# Patient Record
Sex: Female | Born: 1950
Health system: Southern US, Community
[De-identification: ages and names within clinical notes are randomized; demographics above are authoritative.]

## PROBLEM LIST (undated history)

## (undated) DIAGNOSIS — T4145XA Adverse effect of unspecified anesthetic, initial encounter: Secondary | ICD-10-CM

## (undated) DIAGNOSIS — Z923 Personal history of irradiation: Secondary | ICD-10-CM

## (undated) DIAGNOSIS — R011 Cardiac murmur, unspecified: Secondary | ICD-10-CM

## (undated) DIAGNOSIS — I10 Essential (primary) hypertension: Secondary | ICD-10-CM

## (undated) DIAGNOSIS — H9190 Unspecified hearing loss, unspecified ear: Secondary | ICD-10-CM

## (undated) DIAGNOSIS — C50919 Malignant neoplasm of unspecified site of unspecified female breast: Secondary | ICD-10-CM

## (undated) DIAGNOSIS — T8859XA Other complications of anesthesia, initial encounter: Secondary | ICD-10-CM

## (undated) DIAGNOSIS — C801 Malignant (primary) neoplasm, unspecified: Secondary | ICD-10-CM

## (undated) DIAGNOSIS — R112 Nausea with vomiting, unspecified: Secondary | ICD-10-CM

## (undated) DIAGNOSIS — M1990 Unspecified osteoarthritis, unspecified site: Secondary | ICD-10-CM

## (undated) DIAGNOSIS — Z9889 Other specified postprocedural states: Secondary | ICD-10-CM

## (undated) HISTORY — PX: ABDOMINAL HYSTERECTOMY: SHX81

## (undated) HISTORY — DX: Malignant (primary) neoplasm, unspecified: C80.1

## (undated) HISTORY — DX: Malignant neoplasm of unspecified site of unspecified female breast: C50.919

## (undated) HISTORY — PX: GANGLION CYST EXCISION: SHX1691

## (undated) HISTORY — DX: Unspecified osteoarthritis, unspecified site: M19.90

## (undated) HISTORY — DX: Unspecified hearing loss, unspecified ear: H91.90

## (undated) HISTORY — DX: Cardiac murmur, unspecified: R01.1

---

## 1987-08-26 DIAGNOSIS — Z853 Personal history of malignant neoplasm of breast: Secondary | ICD-10-CM | POA: Insufficient documentation

## 1988-08-25 HISTORY — PX: BREAST SURGERY: SHX581

## 1988-08-25 HISTORY — PX: BREAST LUMPECTOMY: SHX2

## 1998-01-08 ENCOUNTER — Other Ambulatory Visit: Admission: RE | Admit: 1998-01-08 | Discharge: 1998-01-08 | Payer: Self-pay | Admitting: Otolaryngology

## 1998-02-07 ENCOUNTER — Ambulatory Visit (HOSPITAL_COMMUNITY): Admission: RE | Admit: 1998-02-07 | Discharge: 1998-02-07 | Payer: Self-pay | Admitting: Family Medicine

## 1999-08-09 ENCOUNTER — Other Ambulatory Visit: Admission: RE | Admit: 1999-08-09 | Discharge: 1999-08-09 | Payer: Self-pay | Admitting: *Deleted

## 1999-08-09 ENCOUNTER — Encounter (INDEPENDENT_AMBULATORY_CARE_PROVIDER_SITE_OTHER): Payer: Self-pay | Admitting: Specialist

## 2000-05-04 ENCOUNTER — Encounter: Admission: RE | Admit: 2000-05-04 | Discharge: 2000-05-04 | Payer: Self-pay | Admitting: Surgery

## 2000-05-04 ENCOUNTER — Encounter: Payer: Self-pay | Admitting: Surgery

## 2000-07-08 ENCOUNTER — Emergency Department (HOSPITAL_COMMUNITY): Admission: EM | Admit: 2000-07-08 | Discharge: 2000-07-08 | Payer: Self-pay | Admitting: Emergency Medicine

## 2001-05-17 ENCOUNTER — Encounter: Admission: RE | Admit: 2001-05-17 | Discharge: 2001-05-17 | Payer: Self-pay | Admitting: *Deleted

## 2001-05-17 ENCOUNTER — Encounter: Payer: Self-pay | Admitting: *Deleted

## 2001-05-24 ENCOUNTER — Other Ambulatory Visit: Admission: RE | Admit: 2001-05-24 | Discharge: 2001-05-24 | Payer: Self-pay | Admitting: *Deleted

## 2002-05-23 ENCOUNTER — Encounter: Payer: Self-pay | Admitting: *Deleted

## 2002-05-23 ENCOUNTER — Encounter: Admission: RE | Admit: 2002-05-23 | Discharge: 2002-05-23 | Payer: Self-pay | Admitting: *Deleted

## 2002-05-30 ENCOUNTER — Encounter: Admission: RE | Admit: 2002-05-30 | Discharge: 2002-05-30 | Payer: Self-pay | Admitting: *Deleted

## 2002-05-30 ENCOUNTER — Encounter: Payer: Self-pay | Admitting: *Deleted

## 2002-05-30 ENCOUNTER — Other Ambulatory Visit: Admission: RE | Admit: 2002-05-30 | Discharge: 2002-05-30 | Payer: Self-pay | Admitting: *Deleted

## 2002-11-25 ENCOUNTER — Encounter: Payer: Self-pay | Admitting: Family Medicine

## 2002-11-25 ENCOUNTER — Ambulatory Visit (HOSPITAL_COMMUNITY): Admission: RE | Admit: 2002-11-25 | Discharge: 2002-11-25 | Payer: Self-pay | Admitting: Family Medicine

## 2002-12-14 ENCOUNTER — Encounter (INDEPENDENT_AMBULATORY_CARE_PROVIDER_SITE_OTHER): Payer: Self-pay

## 2002-12-14 ENCOUNTER — Ambulatory Visit (HOSPITAL_BASED_OUTPATIENT_CLINIC_OR_DEPARTMENT_OTHER): Admission: RE | Admit: 2002-12-14 | Discharge: 2002-12-14 | Payer: Self-pay | Admitting: Orthopedic Surgery

## 2003-06-19 ENCOUNTER — Encounter: Payer: Self-pay | Admitting: *Deleted

## 2003-06-19 ENCOUNTER — Encounter: Admission: RE | Admit: 2003-06-19 | Discharge: 2003-06-19 | Payer: Self-pay | Admitting: *Deleted

## 2003-07-10 ENCOUNTER — Other Ambulatory Visit: Admission: RE | Admit: 2003-07-10 | Discharge: 2003-07-10 | Payer: Self-pay | Admitting: *Deleted

## 2004-06-19 ENCOUNTER — Encounter: Admission: RE | Admit: 2004-06-19 | Discharge: 2004-06-19 | Payer: Self-pay | Admitting: *Deleted

## 2004-07-01 ENCOUNTER — Encounter: Admission: RE | Admit: 2004-07-01 | Discharge: 2004-07-01 | Payer: Self-pay | Admitting: *Deleted

## 2004-10-21 ENCOUNTER — Other Ambulatory Visit: Admission: RE | Admit: 2004-10-21 | Discharge: 2004-10-21 | Payer: Self-pay | Admitting: *Deleted

## 2005-01-06 ENCOUNTER — Encounter: Admission: RE | Admit: 2005-01-06 | Discharge: 2005-01-06 | Payer: Self-pay | Admitting: Surgery

## 2005-08-11 ENCOUNTER — Encounter: Admission: RE | Admit: 2005-08-11 | Discharge: 2005-08-11 | Payer: Self-pay | Admitting: *Deleted

## 2005-09-02 ENCOUNTER — Encounter: Admission: RE | Admit: 2005-09-02 | Discharge: 2005-09-02 | Payer: Self-pay | Admitting: *Deleted

## 2005-12-15 ENCOUNTER — Other Ambulatory Visit: Admission: RE | Admit: 2005-12-15 | Discharge: 2005-12-15 | Payer: Self-pay | Admitting: *Deleted

## 2006-08-26 ENCOUNTER — Encounter: Admission: RE | Admit: 2006-08-26 | Discharge: 2006-08-26 | Payer: Self-pay | Admitting: Surgery

## 2007-03-09 ENCOUNTER — Other Ambulatory Visit: Admission: RE | Admit: 2007-03-09 | Discharge: 2007-03-09 | Payer: Self-pay | Admitting: *Deleted

## 2007-08-30 ENCOUNTER — Encounter: Admission: RE | Admit: 2007-08-30 | Discharge: 2007-08-30 | Payer: Self-pay | Admitting: Surgery

## 2008-09-04 ENCOUNTER — Encounter: Admission: RE | Admit: 2008-09-04 | Discharge: 2008-09-04 | Payer: Self-pay | Admitting: Gynecology

## 2009-08-25 HISTORY — PX: STAPEDES SURGERY: SHX789

## 2009-09-10 ENCOUNTER — Encounter: Admission: RE | Admit: 2009-09-10 | Discharge: 2009-09-10 | Payer: Self-pay | Admitting: Surgery

## 2010-05-20 ENCOUNTER — Encounter: Admission: RE | Admit: 2010-05-20 | Discharge: 2010-05-20 | Payer: Self-pay | Admitting: Otolaryngology

## 2010-07-01 ENCOUNTER — Encounter: Admission: RE | Admit: 2010-07-01 | Discharge: 2010-07-01 | Payer: Self-pay | Admitting: Family Medicine

## 2010-09-16 ENCOUNTER — Encounter
Admission: RE | Admit: 2010-09-16 | Discharge: 2010-09-16 | Payer: Self-pay | Source: Home / Self Care | Attending: Surgery | Admitting: Surgery

## 2010-12-30 ENCOUNTER — Other Ambulatory Visit: Payer: Self-pay | Admitting: Otolaryngology

## 2011-01-10 NOTE — Op Note (Signed)
   [name], Pamela Summers                             ACCOUNT NO.:  [id number]   MEDICAL RECORD NO.:  [id number]                   PATIENT TYPE:  AMB   LOCATION:  DSC                                  FACILITY:  [facility]   PHYSICIAN:  Artist Pais. Mina Marble, M.D.           DATE OF BIRTH:  1950/10/02   DATE OF PROCEDURE:  12/14/2002  DATE OF DISCHARGE:                                 OPERATIVE REPORT   PREOPERATIVE DIAGNOSIS:  Mass left hand in the webspace between thumb and  index finger.   POSTOPERATIVE DIAGNOSIS:  Mass left hand in the webspace between thumb and  index finger.   PROCEDURE:  Excision and biopsy of mass deep webspace thumb and index finger  left hand.   SURGEON:  Artist Pais. Mina Marble, M.D.   ASSISTANT:  Aura Fey. Bobbe Medico.   ANESTHESIA:  Bier block.   TOURNIQUET TIME:  30 minutes.   COMPLICATIONS:  None.   DRAINS:  None.   SPECIMENS:  One specimen sent.   DESCRIPTION OF PROCEDURE:  The patient was taken to the operating room where  after the induction of adequate Bier block analgesia the left upper  extremity was prepped and draped in the usual sterile fashion.  Once this  was done a Brunner-type incision was made in the webspace between the thumb  and index finger.  Dissection volarly was carried out until the digital  nerve to the index finger on the radial side was identified and retracted  safely.  The thenar muscle was split and a large lipoma was encountered  coming from the thenar musculature.  Dissection was carried down using a  combination of blunt dissection with the scissors and a Therapist, nutritional until  a large lipoma was carefully resected in its entirety.  The wound was then  thoroughly irrigated.  Hemostasis was achieved with bipolar cautery.  The  wound was loosely closed with 5-0 nylon in a combination of simple and  horizontal mattress sutures.  Sterile dressing of Xeroform, 4x4's, fluffs,  and compressive hand dressing was applied.  The patient  tolerated the  procedure well and went to recovery room in stable fashion.                                               Artist Pais Mina Marble, M.D.    MAW/MEDQ  D:  12/14/2002  T:  12/14/2002  Job:  [id number]

## 2011-09-01 ENCOUNTER — Other Ambulatory Visit: Payer: Self-pay | Admitting: Gynecology

## 2011-09-01 DIAGNOSIS — Z1231 Encounter for screening mammogram for malignant neoplasm of breast: Secondary | ICD-10-CM

## 2011-09-22 ENCOUNTER — Ambulatory Visit
Admission: RE | Admit: 2011-09-22 | Discharge: 2011-09-22 | Disposition: A | Discharge status: Home / Self Care | Payer: Federal, State, Local not specified - PPO | Source: Ambulatory Visit | Attending: Gynecology | Admitting: Gynecology

## 2011-09-22 DIAGNOSIS — Z1231 Encounter for screening mammogram for malignant neoplasm of breast: Secondary | ICD-10-CM

## 2011-09-24 ENCOUNTER — Other Ambulatory Visit: Payer: Self-pay | Admitting: Gynecology

## 2011-09-24 DIAGNOSIS — R928 Other abnormal and inconclusive findings on diagnostic imaging of breast: Secondary | ICD-10-CM

## 2011-10-06 ENCOUNTER — Ambulatory Visit
Admission: RE | Admit: 2011-10-06 | Discharge: 2011-10-06 | Disposition: A | Discharge status: Home / Self Care | Payer: Federal, State, Local not specified - PPO | Source: Ambulatory Visit | Attending: Gynecology | Admitting: Gynecology

## 2011-10-06 DIAGNOSIS — R928 Other abnormal and inconclusive findings on diagnostic imaging of breast: Secondary | ICD-10-CM

## 2012-01-02 ENCOUNTER — Other Ambulatory Visit: Payer: Self-pay | Admitting: Sports Medicine

## 2012-01-02 DIAGNOSIS — M25512 Pain in left shoulder: Secondary | ICD-10-CM

## 2012-01-02 DIAGNOSIS — M542 Cervicalgia: Secondary | ICD-10-CM

## 2012-01-13 ENCOUNTER — Ambulatory Visit
Admission: RE | Admit: 2012-01-13 | Discharge: 2012-01-13 | Disposition: A | Discharge status: Home / Self Care | Payer: Federal, State, Local not specified - PPO | Source: Ambulatory Visit | Attending: Sports Medicine | Admitting: Sports Medicine

## 2012-01-13 DIAGNOSIS — M542 Cervicalgia: Secondary | ICD-10-CM

## 2012-01-13 DIAGNOSIS — M25512 Pain in left shoulder: Secondary | ICD-10-CM

## 2012-02-18 ENCOUNTER — Other Ambulatory Visit: Payer: Self-pay | Admitting: Gynecology

## 2012-02-18 DIAGNOSIS — N6320 Unspecified lump in the left breast, unspecified quadrant: Secondary | ICD-10-CM

## 2012-03-29 ENCOUNTER — Ambulatory Visit
Admission: RE | Admit: 2012-03-29 | Discharge: 2012-03-29 | Disposition: A | Discharge status: Home / Self Care | Payer: Federal, State, Local not specified - PPO | Source: Ambulatory Visit | Attending: Gynecology | Admitting: Gynecology

## 2012-03-29 DIAGNOSIS — N6320 Unspecified lump in the left breast, unspecified quadrant: Secondary | ICD-10-CM

## 2012-05-10 ENCOUNTER — Ambulatory Visit: Payer: Federal, State, Local not specified - PPO

## 2012-05-10 ENCOUNTER — Ambulatory Visit (INDEPENDENT_AMBULATORY_CARE_PROVIDER_SITE_OTHER): Payer: Federal, State, Local not specified - PPO | Admitting: Emergency Medicine

## 2012-05-10 VITALS — BP 138/85 | HR 79 | Temp 98.6°F | Resp 18 | Ht 66.0 in | Wt 161.0 lb

## 2012-05-10 DIAGNOSIS — M549 Dorsalgia, unspecified: Secondary | ICD-10-CM

## 2012-05-10 LAB — POCT URINALYSIS DIPSTICK
Bilirubin, UA: NEGATIVE
Glucose, UA: NEGATIVE
Ketones, UA: NEGATIVE
Spec Grav, UA: 1.02
Urobilinogen, UA: 0.2

## 2012-05-10 MED ORDER — HYDROCODONE-ACETAMINOPHEN 5-325 MG PO TABS: 1.0000 | ORAL_TABLET | Freq: Four times a day (QID) | ORAL | Status: DC | PRN

## 2012-05-10 MED ORDER — PREDNISONE 20 MG PO TABS: ORAL_TABLET | ORAL | Status: DC

## 2012-05-10 NOTE — Progress Notes (Signed)
  Subjective:    Patient ID: Pamela Summers, female    DOB: 23-Feb-1951, 61 y.o.   MRN: [id number]  HPI patient enters with severe pain in her right lower back and into the right hip. She denies any injury to this area. She has a history of problems a few years ago but has done well since that time. She does work at [employer] and so is involved in lifting pushing and pulling.    Review of Systems     Objective:   Physical Exam there is no CVA pain. There is tenderness over the right SI joint. There is decreased range of motion the right hip. Straight leg raising on the right leg is positive at about 75. Motor strength of the right leg is diminished but I suspect is secondary to pain rather than a true weakness. Left leg exam is normal  [facility] reading (PRIMARY) by  Dr. Cleta Alberts x-rays do not reveal any fractures or malalignment.  Results for orders placed in visit on 05/10/12  POCT URINALYSIS DIPSTICK      Component Value Range   Color, UA yellow     Clarity, UA clear     Glucose, UA neg     Bilirubin, UA neg     Ketones, UA neg     Spec Grav, UA 1.020     Blood, UA neg     pH, UA 5.0     Protein, UA neg     Urobilinogen, UA 0.2     Nitrite, UA neg     Leukocytes, UA Negative          Assessment & Plan:  Patient here with low back pain with radicular symptoms into the right hip. We'll treat with a tapered dose of prednisone pain medications recheck on Thursday to be sure improved

## 2012-05-10 NOTE — Patient Instructions (Addendum)
Back Pain, Adult Low back pain is very common. About 1 in 5 people have back pain.The cause of low back pain is rarely dangerous. The pain often gets better over time.About half of people with a sudden onset of back pain feel better in just 2 weeks. About 8 in 10 people feel better by 6 weeks.  CAUSES Some common causes of back pain include:  Strain of the muscles or ligaments supporting the spine.   Wear and tear (degeneration) of the spinal discs.   Arthritis.   Direct injury to the back.  DIAGNOSIS Most of the time, the direct cause of low back pain is not known.However, back pain can be treated effectively even when the exact cause of the pain is unknown.Answering your caregiver's questions about your overall health and symptoms is one of the most accurate ways to make sure the cause of your pain is not dangerous. If your caregiver needs more information, he or she may order lab work or imaging tests (X-rays or MRIs).However, even if imaging tests show changes in your back, this usually does not require surgery. HOME CARE INSTRUCTIONS For many people, back pain returns.Since low back pain is rarely dangerous, it is often a condition that people can learn to manageon their own.   Remain active. It is stressful on the back to sit or stand in one place. Do not sit, drive, or stand in one place for more than 30 minutes at a time. Take short walks on level surfaces as soon as pain allows.Try to increase the length of time you walk each day.   Do not stay in bed.Resting more than 1 or 2 days can delay your recovery.   Do not avoid exercise or work.Your body is made to move.It is not dangerous to be active, even though your back may hurt.Your back will likely heal faster if you return to being active before your pain is gone.   Pay attention to your body when you bend and lift. Many people have less discomfortwhen lifting if they bend their knees, keep the load close to their  bodies,and avoid twisting. Often, the most comfortable positions are those that put less stress on your recovering back.   Find a comfortable position to sleep. Use a firm mattress and lie on your side with your knees slightly bent. If you lie on your back, put a pillow under your knees.   Only take over-the-counter or prescription medicines as directed by your caregiver. Over-the-counter medicines to reduce pain and inflammation are often the most helpful.Your caregiver may prescribe muscle relaxant drugs.These medicines help dull your pain so you can more quickly return to your normal activities and healthy exercise.   Put ice on the injured area.   Put ice in a plastic bag.   Place a towel between your skin and the bag.   Leave the ice on for 15 to 20 minutes, 3 to 4 times a day for the first 2 to 3 days. After that, ice and heat may be alternated to reduce pain and spasms.   Ask your caregiver about trying back exercises and gentle massage. This may be of some benefit.   Avoid feeling anxious or stressed.Stress increases muscle tension and can worsen back pain.It is important to recognize when you are anxious or stressed and learn ways to manage it.Exercise is a great option.  SEEK MEDICAL CARE IF:  You have pain that is not relieved with rest or medicine.   You have   pain that does not improve in 1 week.   You have new symptoms.   You are generally not feeling well.  SEEK IMMEDIATE MEDICAL CARE IF:   You have pain that radiates from your back into your legs.   You develop new bowel or bladder control problems.   You have unusual weakness or numbness in your arms or legs.   You develop nausea or vomiting.   You develop abdominal pain.   You feel faint.  Document Released: 08/11/2005 Document Revised: 07/31/2011 Document Reviewed: 12/30/2010 Exit[employer] Patient Information 2012 [employer]. 

## 2012-05-13 ENCOUNTER — Ambulatory Visit: Payer: Federal, State, Local not specified - PPO

## 2012-05-13 ENCOUNTER — Ambulatory Visit (INDEPENDENT_AMBULATORY_CARE_PROVIDER_SITE_OTHER): Payer: Federal, State, Local not specified - PPO | Admitting: Emergency Medicine

## 2012-05-13 VITALS — BP 120/76 | HR 81 | Temp 98.4°F | Resp 16 | Ht 66.0 in | Wt 165.0 lb

## 2012-05-13 DIAGNOSIS — R05 Cough: Secondary | ICD-10-CM

## 2012-05-13 DIAGNOSIS — J40 Bronchitis, not specified as acute or chronic: Secondary | ICD-10-CM

## 2012-05-13 DIAGNOSIS — R062 Wheezing: Secondary | ICD-10-CM

## 2012-05-13 DIAGNOSIS — R059 Cough, unspecified: Secondary | ICD-10-CM

## 2012-05-13 DIAGNOSIS — R9431 Abnormal electrocardiogram [ECG] [EKG]: Secondary | ICD-10-CM

## 2012-05-13 MED ORDER — CEFDINIR 300 MG PO CAPS: 600.0000 mg | ORAL_CAPSULE | Freq: Every day | ORAL | Status: DC

## 2012-05-13 MED ORDER — ALBUTEROL SULFATE (2.5 MG/3ML) 0.083% IN NEBU
2.5000 mg | INHALATION_SOLUTION | Freq: Once | RESPIRATORY_TRACT | Status: AC
  Administered 2012-05-13: 2.5 mg via RESPIRATORY_TRACT

## 2012-05-13 NOTE — Progress Notes (Signed)
  Subjective:    Patient ID: Pamela Summers, female    DOB: [birth year], 61 y.o.   MRN: [id number]  HPI  Patient is here for a follow up.  She still has the cough and having pain in the lower back.  On the fourth day of her prednisone.  Patient would like a MRI of back.  Patient states that she is real sore in the lower mid back.  Coughing up thick mucus. She states she has recently completed her antibiotics. She continues to have pain in her right SI joint. She denies any weakness or radicular symptoms down her right leg. She has no bowel or bladder symptoms.       Review of Systems     Objective:   Physical Exam examination the chest reveals diminished breath sounds in the bases with prolongation of expiration without true wheezes. Her cardiac exam is unremarkable. Examination of the back reveals significant tenderness superior right SI joint. Straight leg raising is negative to 90 both legs. Motor strength is 5 out of 5 all muscle groups. Sensory exam is Normal.  [facility] reading (PRIMARY) by  Dr.Daub patient is an increase in heart size no pneumonia is seen. There are clips present in the right anterior chest. There is mild increase in markings  EKG T wave inversion V1 V2      Assessment & Plan:  Patient improved on prednisone. She does have a residual bronchitis and I have added omnicef  one a day. We'll see back next Wednesday decide on need for an MRI at that time I have referred her back to Dr. Garnette Scheuermann who is her cardiologist since she has not seen him in a few years.

## 2012-05-18 ENCOUNTER — Telehealth: Payer: Self-pay

## 2012-05-18 NOTE — Telephone Encounter (Signed)
Please call patient later noted there is an abnormal area over one of the ribs and clavicle. She needs to see me in 2 weeks for a repeat chest x-ray to be sure these areas resolve. If they do not we will need to do a CT of her chest. I have called Patient to advise.

## 2012-05-18 NOTE — Telephone Encounter (Signed)
Pt returning call re: her x-ray.  [phone]

## 2012-05-19 ENCOUNTER — Ambulatory Visit (INDEPENDENT_AMBULATORY_CARE_PROVIDER_SITE_OTHER): Payer: Federal, State, Local not specified - PPO | Admitting: Emergency Medicine

## 2012-05-19 VITALS — BP 134/88 | HR 65 | Temp 98.4°F | Resp 16 | Ht 66.0 in | Wt 165.0 lb

## 2012-05-19 DIAGNOSIS — J40 Bronchitis, not specified as acute or chronic: Secondary | ICD-10-CM

## 2012-05-19 DIAGNOSIS — R9389 Abnormal findings on diagnostic imaging of other specified body structures: Secondary | ICD-10-CM

## 2012-05-19 DIAGNOSIS — R918 Other nonspecific abnormal finding of lung field: Secondary | ICD-10-CM

## 2012-05-19 DIAGNOSIS — I517 Cardiomegaly: Secondary | ICD-10-CM

## 2012-05-19 NOTE — Progress Notes (Signed)
  Subjective:    Patient ID: Pamela Summers, female    DOB: July 21, 1951, 61 y.o.   MRN: [id number]  HPI Followup of her respiratory infection. She's been on Omnicef and cough medication and doing much better. The radiologist did see an abnormal area on her chest x-ray and is concerned it may be in the bone beneath the left clavicle.   Review of Systems     Objective:   Physical Exam patient is alert and cooperative appears well today. Her physical examination her neck is supple. Her chest is clear to both auscultation and percussion. There is no palpable abnormality of the left upper anterior chest        Assessment & Plan:  Patient doing well with a respiratory infection. She is scheduled to see Dr. Katrinka Blazing this afternoon. CT chest with and without contrast to evaluate the lesion seen on chest x-ray

## 2012-05-24 ENCOUNTER — Other Ambulatory Visit: Payer: Federal, State, Local not specified - PPO

## 2012-05-24 ENCOUNTER — Ambulatory Visit
Admission: RE | Admit: 2012-05-24 | Discharge: 2012-05-24 | Disposition: A | Discharge status: Home / Self Care | Payer: Federal, State, Local not specified - PPO | Source: Ambulatory Visit | Attending: Emergency Medicine | Admitting: Emergency Medicine

## 2012-05-24 DIAGNOSIS — R9389 Abnormal findings on diagnostic imaging of other specified body structures: Secondary | ICD-10-CM

## 2012-05-24 MED ORDER — IOHEXOL 300 MG/ML  SOLN
75.0000 mL | Freq: Once | INTRAMUSCULAR | Status: AC | PRN
  Administered 2012-05-24: 75 mL via INTRAVENOUS

## 2012-05-25 ENCOUNTER — Telehealth: Payer: Self-pay

## 2012-05-25 NOTE — Telephone Encounter (Signed)
Pt spoke with Renee in labs earlier today and she is needing to see if a copy of her labs can be sent to Dr. Verdis Prime please fax to [phone] if questions contact their office @ [phone]

## 2012-05-26 NOTE — Telephone Encounter (Signed)
Faxed lab, xray report, and CT report to the fax number pt gave.

## 2012-08-10 ENCOUNTER — Other Ambulatory Visit: Payer: Self-pay | Admitting: Gynecology

## 2012-08-10 DIAGNOSIS — Z1231 Encounter for screening mammogram for malignant neoplasm of breast: Secondary | ICD-10-CM

## 2012-08-25 HISTORY — PX: MASTECTOMY: SHX3

## 2012-08-25 HISTORY — PX: BREAST BIOPSY: SHX20

## 2012-09-27 ENCOUNTER — Ambulatory Visit
Admission: RE | Admit: 2012-09-27 | Discharge: 2012-09-27 | Disposition: A | Discharge status: Home / Self Care | Payer: Federal, State, Local not specified - PPO | Source: Ambulatory Visit | Attending: Gynecology | Admitting: Gynecology

## 2012-09-27 DIAGNOSIS — Z1231 Encounter for screening mammogram for malignant neoplasm of breast: Secondary | ICD-10-CM

## 2012-09-28 ENCOUNTER — Other Ambulatory Visit: Payer: Self-pay | Admitting: Gynecology

## 2012-09-28 DIAGNOSIS — R928 Other abnormal and inconclusive findings on diagnostic imaging of breast: Secondary | ICD-10-CM

## 2012-10-04 ENCOUNTER — Ambulatory Visit
Admission: RE | Admit: 2012-10-04 | Discharge: 2012-10-04 | Disposition: A | Discharge status: Home / Self Care | Payer: Federal, State, Local not specified - PPO | Source: Ambulatory Visit | Attending: Gynecology | Admitting: Gynecology

## 2012-10-04 ENCOUNTER — Other Ambulatory Visit: Payer: Self-pay | Admitting: Gynecology

## 2012-10-04 ENCOUNTER — Ambulatory Visit
Admission: RE | Admit: 2012-10-04 | Discharge: 2012-10-04 | Disposition: A | Discharge status: Home / Self Care | Payer: Federal, State, Local not specified - PPO | Source: Ambulatory Visit

## 2012-10-04 DIAGNOSIS — R928 Other abnormal and inconclusive findings on diagnostic imaging of breast: Secondary | ICD-10-CM

## 2012-10-05 ENCOUNTER — Other Ambulatory Visit (INDEPENDENT_AMBULATORY_CARE_PROVIDER_SITE_OTHER): Payer: Self-pay | Admitting: Surgery

## 2012-10-05 ENCOUNTER — Ambulatory Visit
Admission: RE | Admit: 2012-10-05 | Discharge: 2012-10-05 | Disposition: A | Discharge status: Home / Self Care | Payer: Federal, State, Local not specified - PPO | Source: Ambulatory Visit | Attending: Gynecology | Admitting: Gynecology

## 2012-10-05 ENCOUNTER — Other Ambulatory Visit: Payer: Self-pay | Admitting: Gynecology

## 2012-10-05 DIAGNOSIS — R928 Other abnormal and inconclusive findings on diagnostic imaging of breast: Secondary | ICD-10-CM

## 2012-10-05 DIAGNOSIS — D0511 Intraductal carcinoma in situ of right breast: Secondary | ICD-10-CM

## 2012-10-10 ENCOUNTER — Ambulatory Visit
Admission: RE | Admit: 2012-10-10 | Discharge: 2012-10-10 | Disposition: A | Discharge status: Home / Self Care | Payer: Federal, State, Local not specified - PPO | Source: Ambulatory Visit | Attending: Gynecology | Admitting: Gynecology

## 2012-10-10 DIAGNOSIS — D0511 Intraductal carcinoma in situ of right breast: Secondary | ICD-10-CM

## 2012-10-10 MED ORDER — GADOBENATE DIMEGLUMINE 529 MG/ML IV SOLN
14.0000 mL | Freq: Once | INTRAVENOUS | Status: AC | PRN
  Administered 2012-10-10: 14 mL via INTRAVENOUS

## 2012-10-11 ENCOUNTER — Inpatient Hospital Stay: Admission: RE | Admit: 2012-10-11 | Payer: Federal, State, Local not specified - PPO | Source: Ambulatory Visit

## 2012-10-15 ENCOUNTER — Ambulatory Visit (INDEPENDENT_AMBULATORY_CARE_PROVIDER_SITE_OTHER): Payer: Federal, State, Local not specified - PPO | Admitting: Surgery

## 2012-10-15 ENCOUNTER — Encounter (INDEPENDENT_AMBULATORY_CARE_PROVIDER_SITE_OTHER): Payer: Self-pay | Admitting: Surgery

## 2012-10-15 VITALS — BP 170/98 | HR 76 | Temp 97.8°F | Resp 18 | Ht 66.0 in | Wt 164.2 lb

## 2012-10-15 DIAGNOSIS — Z853 Personal history of malignant neoplasm of breast: Secondary | ICD-10-CM

## 2012-10-15 DIAGNOSIS — C50919 Malignant neoplasm of unspecified site of unspecified female breast: Secondary | ICD-10-CM

## 2012-10-15 DIAGNOSIS — C50911 Malignant neoplasm of unspecified site of right female breast: Secondary | ICD-10-CM | POA: Insufficient documentation

## 2012-10-15 NOTE — Progress Notes (Signed)
Patient ID: Pamela Summers, female   DOB: 1950-09-07, 62 y.o.   MRN: [id number]  Chief Complaint  Patient presents with  . Breast Cancer    Right    HPI Pamela Summers is a 62 y.o. female.  She recently had a mammogram and an abnormality found, confirmed by sono, biopsied ans was DCIS, weakly receptor +. She had a lumpectomy and node dissectin in 1989 for what I think was invasive ductal in the right breast, upper ouoter quadrant. The current abnormality is tight breast upper inner quadrant. She has no other breast sypmtoms. Her mother may have had ovarian cancer, but she is unsure. She asked about BRCA testing because she was about age 77 at the first cancer. She comes to discyuss surgical options HPI  Past Medical History  Diagnosis Date  . Arthritis   . Heart murmur   . Cancer   . Hearing loss     Past Surgical History  Procedure Laterality Date  . Abdominal hysterectomy      partical  . Breast surgery  1990    lumpectomy - right  . Ganglion cyst excision  2002 - approximate  . Stapedes surgery Right 2011    Family History  Problem Relation Age of Onset  . Cancer Mother     uterine  . Heart disease Father     Social History History  Substance Use Topics  . Smoking status: Never Smoker   . Smokeless tobacco: Never Used  . Alcohol Use: No    No Known Allergies  No current outpatient prescriptions on file.   No current facility-administered medications for this visit.    Review of Systems Review of Systems  Constitutional: Negative for fever, chills and unexpected weight change.  HENT: Positive for hearing loss. Negative for congestion, sore throat, trouble swallowing and voice change.   Eyes: Negative for visual disturbance.  Respiratory: Negative for cough and wheezing.   Cardiovascular: Negative for chest pain, palpitations and leg swelling.  Gastrointestinal: Negative for nausea, vomiting, abdominal pain, diarrhea, constipation, blood in stool, abdominal  distention and anal bleeding.  Genitourinary: Negative for hematuria, vaginal bleeding and difficulty urinating.  Musculoskeletal: Negative for arthralgias.  Skin: Negative for rash and wound.  Neurological: Negative for seizures, syncope and headaches.  Hematological: Negative for adenopathy. Does not bruise/bleed easily.  Psychiatric/Behavioral: Negative for confusion.    Blood pressure 170/98, pulse 76, temperature 97.8 F (36.6 C), temperature source Temporal, resp. rate 18, height 5\' 6"  (1.676 m), weight 164 lb 4 oz (74.503 kg).  Physical Exam Physical Exam  Vitals reviewed. Constitutional: She is oriented to person, place, and time. She appears well-developed and well-nourished. No distress.  HENT:  Head: Normocephalic and atraumatic.  Mouth/Throat: Oropharynx is clear and moist.  Eyes: Conjunctivae and EOM are normal. Pupils are equal, round, and reactive to light. No scleral icterus.  Neck: Normal range of motion. Neck supple. No tracheal deviation present. No thyromegaly present.  Cardiovascular: Normal rate, regular rhythm, normal heart sounds and intact distal pulses.  Exam reveals no gallop and no friction rub.   No murmur heard. Pulmonary/Chest: Effort normal and breath sounds normal. No respiratory distress. She has no wheezes. She has no rales. Right breast exhibits tenderness. Right breast exhibits no inverted nipple, no mass, no nipple discharge and no skin change. Left breast exhibits tenderness. Left breast exhibits no inverted nipple, no mass, no nipple discharge and no skin change. Breasts are asymmetrical.    Surgical scars as noted.  Right smaller than left and some minor skin changes from radiation  Abdominal: Soft. Bowel sounds are normal. She exhibits no distension and no mass. There is no tenderness. There is no rebound and no guarding.  Musculoskeletal: Normal range of motion. She exhibits no edema and no tenderness.  Lymphadenopathy:    She has no cervical  adenopathy.    She has no axillary adenopathy.       Right: No supraclavicular adenopathy present.       Left: No supraclavicular adenopathy present.  No right axillary contents  Neurological: She is alert and oriented to person, place, and time.  Skin: Skin is warm and dry. No rash noted. She is not diaphoretic. No erythema.  Psychiatric: She has a normal mood and affect. Her behavior is normal. Judgment and thought content normal.    Data Reviewed:I reviewed the mammogram reports and films and pathology reports as well as some old office notes but in 1989 notes are not available   Assessment: Clinical stage 0 right breast cancer, upper inner quadrant History of right breast cancer, probable invasive ductal, upper outer quadrant        Plan: I think she should have BRCA testing since she was about age 63 her initial diagnosis and her mother may have had ovarian cancer. She'll need a mastectomy I believe she has a second cancer and a smaller breast and is not a good candidate therefore for another lumpectomy. She could have a potential immediate reconstruction and we will arrange plastic surgical consultation. Once these 2 consultations have been done we can make definitive plans. A lengthy period of time with the patient her daughter going over all of these options.    ant        Richardo Popoff J 10/15/2012, 10:14 AM

## 2012-10-15 NOTE — Addendum Note (Signed)
Addended byLiliana Cline on: 10/15/2012 02:09 PM   Modules accepted: Orders

## 2012-10-15 NOTE — Addendum Note (Signed)
Addended byLiliana Cline on: 10/15/2012 03:57 PM   Modules accepted: Orders

## 2012-10-15 NOTE — Patient Instructions (Signed)
We will arrange for a genetic counselor to talk to you about BRCA testing and also arrange a visit with a plastic surgeon to talk about reconstruction done at the time of breast surgery

## 2012-10-18 ENCOUNTER — Telehealth (INDEPENDENT_AMBULATORY_CARE_PROVIDER_SITE_OTHER): Payer: Self-pay | Admitting: General Surgery

## 2012-10-18 NOTE — Telephone Encounter (Signed)
Patient has appt with Dr Odis Luster 11/01/12

## 2012-10-20 ENCOUNTER — Telehealth: Payer: Self-pay | Admitting: *Deleted

## 2012-10-20 NOTE — Telephone Encounter (Signed)
Left message for pt to return my call so I can schedule a genetic appt.  

## 2012-10-21 ENCOUNTER — Telehealth: Payer: Self-pay | Admitting: *Deleted

## 2012-10-21 NOTE — Telephone Encounter (Signed)
Confirmed genetic counseling appt for 10/26/12 at 1100.

## 2012-10-24 ENCOUNTER — Ambulatory Visit (INDEPENDENT_AMBULATORY_CARE_PROVIDER_SITE_OTHER): Payer: Federal, State, Local not specified - PPO | Admitting: Emergency Medicine

## 2012-10-24 VITALS — BP 188/104 | HR 88 | Temp 98.6°F | Resp 17 | Ht 67.0 in | Wt 160.0 lb

## 2012-10-24 DIAGNOSIS — I10 Essential (primary) hypertension: Secondary | ICD-10-CM

## 2012-10-24 MED ORDER — HYDROCHLOROTHIAZIDE 12.5 MG PO TABS: 12.5000 mg | ORAL_TABLET | Freq: Every day | ORAL | Status: DC

## 2012-10-24 MED ORDER — METOPROLOL TARTRATE 25 MG PO TABS: 25.0000 mg | ORAL_TABLET | Freq: Two times a day (BID) | ORAL | Status: DC

## 2012-10-24 NOTE — Progress Notes (Signed)
  Subjective:    Patient ID: Pamela Summers, female    DOB: 12/22/1950, 62 y.o.   MRN: [id number]  HPI  Patient comes in today and is concerned about her blood pressure being so high. She states that she never had a problem with it in the past. She reports that she is dizzy. Today in triage it was 188/104. She does not take blood pressure medications and she watches her diet. She controls her sodium intake.   She has a major surgery coming up. She has to have a mastectomy on her right side. She is going to have an augmentation done. She meets with her surgeons on Friday. This is the second time she has had breast cancer. She has a history of cancer in her family.     Review of Systems     Objective:   Physical Exam  Blood Pressure taken at exam manually on left arm 160/110, Right arm 160/110. Eyes present with a little cataracts. Lung sounds clear, Heart is regular rhythm.       Assessment & Plan:  1. Start Toporol every day. 2. Start HCTZ

## 2012-10-24 NOTE — Patient Instructions (Signed)

## 2012-10-26 ENCOUNTER — Ambulatory Visit (HOSPITAL_BASED_OUTPATIENT_CLINIC_OR_DEPARTMENT_OTHER): Payer: Federal, State, Local not specified - PPO | Admitting: Genetic Counselor

## 2012-10-26 ENCOUNTER — Other Ambulatory Visit: Payer: Federal, State, Local not specified - PPO | Admitting: Lab

## 2012-10-26 DIAGNOSIS — C50919 Malignant neoplasm of unspecified site of unspecified female breast: Secondary | ICD-10-CM

## 2012-10-26 DIAGNOSIS — C50911 Malignant neoplasm of unspecified site of right female breast: Secondary | ICD-10-CM

## 2012-10-26 DIAGNOSIS — Z853 Personal history of malignant neoplasm of breast: Secondary | ICD-10-CM

## 2012-10-26 DIAGNOSIS — IMO0002 Reserved for concepts with insufficient information to code with codable children: Secondary | ICD-10-CM

## 2012-10-27 ENCOUNTER — Encounter: Payer: Self-pay | Admitting: Genetic Counselor

## 2012-10-27 NOTE — Progress Notes (Signed)
Dr.  Cyndia Bent requested a consultation for genetic counseling and risk assessment for Pamela Summers, a 62 y.o. female, for discussion of her personal history of breast cancer. She presents to clinic today to discuss the possibility of a genetic predisposition to cancer, and to further clarify her risks, as well as her family members' risks for cancer.   HISTORY OF PRESENT ILLNESS: In 1989, at the age of 61, Pamela Summers was diagnosed with breast cancer. Again, in 2014, Pamela Summers was diagnosed with breast cancer.   Past Medical History  Diagnosis Date  . Arthritis   . Heart murmur   . Cancer   . Hearing loss   . Breast cancer 1989, 2014    Past Surgical History  Procedure Laterality Date  . Abdominal hysterectomy      partical  . Breast surgery  1990    lumpectomy - right  . Ganglion cyst excision  2002 - approximate  . Stapedes surgery Right 2011    History  Substance Use Topics  . Smoking status: Never Smoker   . Smokeless tobacco: Never Used  . Alcohol Use: No    REPRODUCTIVE HISTORY AND PERSONAL RISK ASSESSMENT FACTORS: Menarche was at age 42.   Menopause at [age] at time of partial hysterectomy Uterus Intact: No Ovaries Intact: Yes G1P1A0 , first live birth at age 72  She has not previously undergone treatment for infertility.   OCP use for 20 years   She has not used HRT in the past.    FAMILY HISTORY:  We obtained a detailed, 4-generation family history.  Significant diagnoses are listed below: Family History  Problem Relation Age of Onset  . Cancer Mother     uterine or ovarian cancer  . Heart disease Father   . Cancer Sister     maternal half sister with uterine or cervical cancer; died in her 18s  . Cancer Maternal Uncle     unknown cancer  . Cancer Cousin     3 maternal cousins with unknown cancers  The patient was diagnosed with breast cancer at ages 51 and 68.  She has a maternal half sister who was diagnosed with either uterine or cervical  cancer and died in her 83s.  The patient has a total of one full sister, six maternal half siblings, and four known paternal half siblings.  The patient's mother was diagnosed with either uterine or ovarian cacner and died at 52.  She had four brothers and two sisters.  One brother had an unknown cancer.  A sister had a daughter with an unknown cancer, and she had a daughter with an unknown cancer, possibly breast.  This same aunt had a son whose daughter had an unknown cancer.  Patient's maternal ancestors are of African American descent, and paternal ancestors are of African American descent. There is no reported Ashkenazi Jewish ancestry. There is no  known consanguinity.  GENETIC COUNSELING RISK ASSESSMENT, DISCUSSION, AND SUGGESTED FOLLOW UP: We reviewed the natural history and genetic etiology of sporadic, familial and hereditary cancer syndromes.  About 5-10% of breast cancer is hereditary.  Of this, about 85% is the result of a BRCA1 or BRCA2 mutation.  We reviewed the red flags of hereditary cancer syndromes and the dominant inheritance patterns.  If the BRCA testing is negative, we discussed that we could be testing for the wrong gene.  We discussed gene panels, and that several cancer genes that are associated with different cancers can be tested  at the same time.  Because of the different types of cancer that are in the patient's family, we will consider one of the panel tests if she is negative for BRCA mutations.   The patient's personal history of breast cancer is suggestive of the following possible diagnosis: hereditary cancer syndrome  We discussed that identification of a hereditary cancer syndrome may help her care providers tailor the patients medical management. If a mutation indicating a hereditary cancer syndrome is detected in this case, the [employer] recommendations would include increased cancer surveillance and possible prophylactic surgery. If a  mutation is detected, the patient will be referred back to the referring provider and to any additional appropriate care providers to discuss the relevant options.   If a mutation is not found in the patient, this will decrease the likelihood of a hereditary cancer syndrome as the explanation for hedr breast cancer. Cancer surveillance options would be discussed for the patient according to the appropriate standard National Comprehensive Cancer Network and American Cancer Society guidelines, with consideration of their personal and family history risk factors. In this case, the patient will be referred back to their care providers for discussions of management.   I After considering the risks, benefits, and limitations, the patient provided informed consent for  the following  testing: My[facility] through [facility].   Per the patient's request, we will contact her by telephone to discuss these results. A follow up genetic counseling visit will be scheduled if indicated.  The patient was seen for a total of 60 minutes, greater than 50% of which was spent face-to-face counseling.  This plan is being carried out per Dr. Barbaraann Share recommendations.  This note will also be sent to the referring provider via the electronic medical record. The patient will be supplied with a summary of this genetic counseling discussion as well as educational information on the discussed hereditary cancer syndromes following the conclusion of their visit.   Patient was discussed with Dr. Drue Second.    _______________________________________________________________________ For Office Staff:  Number of people involved in session: 2 Was an Intern/ student involved with case: no }

## 2012-11-11 ENCOUNTER — Encounter (INDEPENDENT_AMBULATORY_CARE_PROVIDER_SITE_OTHER): Payer: Self-pay

## 2012-11-12 ENCOUNTER — Telehealth: Payer: Self-pay | Admitting: Genetic Counselor

## 2012-11-12 NOTE — Telephone Encounter (Signed)
Revealed negative genetic testing, however patient does have 4 VUS's.  Discussed what this means that that we would not change her medical management based on this result.

## 2012-11-17 ENCOUNTER — Encounter: Payer: Self-pay | Admitting: Genetic Counselor

## 2012-11-19 ENCOUNTER — Encounter (INDEPENDENT_AMBULATORY_CARE_PROVIDER_SITE_OTHER): Payer: Federal, State, Local not specified - PPO | Admitting: Surgery

## 2012-11-26 ENCOUNTER — Ambulatory Visit (INDEPENDENT_AMBULATORY_CARE_PROVIDER_SITE_OTHER): Payer: Federal, State, Local not specified - PPO | Admitting: Surgery

## 2012-11-26 ENCOUNTER — Encounter (INDEPENDENT_AMBULATORY_CARE_PROVIDER_SITE_OTHER): Payer: Self-pay | Admitting: Surgery

## 2012-11-26 VITALS — BP 130/84 | HR 68 | Temp 97.2°F | Resp 16 | Ht 66.0 in | Wt 164.6 lb

## 2012-11-26 DIAGNOSIS — C50911 Malignant neoplasm of unspecified site of right female breast: Secondary | ICD-10-CM

## 2012-11-26 DIAGNOSIS — C50919 Malignant neoplasm of unspecified site of unspecified female breast: Secondary | ICD-10-CM

## 2012-11-26 NOTE — Progress Notes (Signed)
Chief complaint: Right breast cancer, multifocal  History of present illness: This patient presented a few weeks ago with what appears to be multifocal right breast cancer. About 25 years ago she was treated with a right lumpectomy and radiation therapy for breast cancer. Since I saw her in the office she has had genetic testing which is negative. She seen a Engineer, petroleum and offered alternatives for immediate reconstructions with TRAM flap and latissimus flap. She is still thinking over her options.  Today's visit was spent entirely in counseling. We spent 25 minutes. I reviewed over her diagnosis, treatment options and reconstructive options. I think all questions have been answered. She is going to look at extra prostheses and then make up her mind about whether she wants to have a latissimus reconstruction or a simple mastectomy without reconstruction. She has decided not to have a TRAM flap reconstruction.

## 2012-11-26 NOTE — Patient Instructions (Signed)
Give me a call when you have made decision about which type surgery youo wish to have and we will get this scheduled

## 2012-12-22 ENCOUNTER — Ambulatory Visit (INDEPENDENT_AMBULATORY_CARE_PROVIDER_SITE_OTHER): Payer: Federal, State, Local not specified - PPO | Admitting: Emergency Medicine

## 2012-12-22 VITALS — BP 148/88 | HR 64 | Temp 98.0°F | Resp 16 | Ht 66.0 in | Wt 164.0 lb

## 2012-12-22 DIAGNOSIS — M549 Dorsalgia, unspecified: Secondary | ICD-10-CM

## 2012-12-22 DIAGNOSIS — R8281 Pyuria: Secondary | ICD-10-CM

## 2012-12-22 DIAGNOSIS — R300 Dysuria: Secondary | ICD-10-CM

## 2012-12-22 DIAGNOSIS — R82998 Other abnormal findings in urine: Secondary | ICD-10-CM

## 2012-12-22 LAB — POCT URINALYSIS DIPSTICK
Glucose, UA: NEGATIVE
Nitrite, UA: NEGATIVE
Protein, UA: NEGATIVE
Urobilinogen, UA: 0.2

## 2012-12-22 LAB — POCT UA - MICROSCOPIC ONLY
Crystals, Ur, HPF, POC: NEGATIVE
Yeast, UA: NEGATIVE

## 2012-12-22 NOTE — Progress Notes (Signed)
62 year old female here to follow up on several things.  She took a gene mutation test that came back negative.    Saw Dr. Jamey Ripa about mastectomy and wants to make make a decision about getting an implant.  Complains low right sided back pain that has been going on for a long time.  She is also following up on blood pressure, she was suppose to be taking 2 tablets and she has decreased to 1 tablet about 2 to 2 and 1/2 because she thought it caused her to have a headache.

## 2012-12-22 NOTE — Progress Notes (Signed)
  Subjective:    Patient ID: Pamela Summers, female    DOB: [birth year], 62 y.o.   MRN: [id number]  HPI 62 year old female here to follow up on several things.  She took a gene mutation test that came back negative.    Saw Dr. Jamey Ripa about mastectomy and wants to make make a decision about getting an implant.  Complains low right sided back pain that has been going on for a long time.  She is also following up on blood pressure, she was suppose to be taking 2 tablets and she has decreased to 1 tablet about 2 to 2 and 1/2 because she thought it caused her to have a headache.   Review of Systems     Objective:   Physical Exam HEENT exam is remarkable chest is clear heart regular rate no murmurs. She's tender over the right paralumbar area. She has no focal neurological or muscular skeletal deficits of lower charming. Previous x-rays of the back and hip were unremarkable.  Results for orders placed in visit on 12/22/12  POCT URINALYSIS DIPSTICK      Result Value Range   Color, UA yellow     Clarity, UA clear     Glucose, UA neg     Bilirubin, UA neg     Ketones, UA neg     Spec Grav, UA 1.025     Blood, UA trace     pH, UA 5.5     Protein, UA neg     Urobilinogen, UA 0.2     Nitrite, UA neg     Leukocytes, UA Trace    POCT UA - MICROSCOPIC ONLY      Result Value Range   WBC, Ur, HPF, POC 3-5     RBC, urine, microscopic 2-3     Bacteria, U Microscopic small     Mucus, UA neg     Epithelial cells, urine per micros 0-3     Crystals, Ur, HPF, POC neg     Casts, Ur, LPF, POC neg     Yeast, UA neg          Assessment & Plan:       will check MRI of the back due to her history of breast cancer. Urine is midly abnormal, will check culture. Advised to take blood pressure medication twice a day as directed.

## 2012-12-23 ENCOUNTER — Telehealth: Payer: Self-pay

## 2012-12-23 NOTE — Telephone Encounter (Signed)
Pt is calling because she has question about her referral she believes she had missed call Call back number is [phone]

## 2012-12-24 LAB — URINE CULTURE
Colony Count: NO GROWTH
Organism ID, Bacteria: NO GROWTH

## 2012-12-26 ENCOUNTER — Ambulatory Visit
Admission: RE | Admit: 2012-12-26 | Discharge: 2012-12-26 | Disposition: A | Discharge status: Home / Self Care | Payer: Federal, State, Local not specified - PPO | Source: Ambulatory Visit | Attending: Emergency Medicine | Admitting: Emergency Medicine

## 2012-12-26 DIAGNOSIS — M549 Dorsalgia, unspecified: Secondary | ICD-10-CM

## 2012-12-27 ENCOUNTER — Other Ambulatory Visit: Payer: Self-pay | Admitting: Emergency Medicine

## 2012-12-27 DIAGNOSIS — M549 Dorsalgia, unspecified: Secondary | ICD-10-CM

## 2013-02-15 ENCOUNTER — Telehealth (INDEPENDENT_AMBULATORY_CARE_PROVIDER_SITE_OTHER): Payer: Self-pay

## 2013-02-15 NOTE — Telephone Encounter (Signed)
Called and spoke with patient. She is ready to go ahead and get surgery scheduled. She thinks she has decided on a simple mastectomy with no reconstruction and wants to go ahead and get a date in September 2014. I told her I would get a message to Dr Jamey Ripa so that he can write orders and send them to our scheduling department. She will call with any questions in the meantime.

## 2013-02-15 NOTE — Telephone Encounter (Signed)
Patient is requesting to speak only to JADE. Offered to try to answer her questions but patient refused

## 2013-02-16 NOTE — Telephone Encounter (Signed)
Does she really want to wait till September  She does have cancer and we need to move ahead

## 2013-02-23 NOTE — Telephone Encounter (Signed)
She wanted to know if she could wait til next year. I encouraged her to get this done as soon as possible. She still had not completely made up her mind about surgery but I encouraged her to go ahead and get a date scheduled. She wanted to get scheduled for a mastectomy without reconstruction so she could get a date but states she was not 100% about the no reconstruction. She is aware if this needs coordinated date may have to change.

## 2013-02-23 NOTE — Telephone Encounter (Signed)
She really needs to have surgery soon, preferably July or early august. She definitely should not wait till next year as she may have metastatic disease by then

## 2013-03-28 ENCOUNTER — Ambulatory Visit (INDEPENDENT_AMBULATORY_CARE_PROVIDER_SITE_OTHER): Payer: Federal, State, Local not specified - PPO | Admitting: Surgery

## 2013-03-28 ENCOUNTER — Encounter (INDEPENDENT_AMBULATORY_CARE_PROVIDER_SITE_OTHER): Payer: Self-pay | Admitting: Surgery

## 2013-03-28 ENCOUNTER — Encounter (INDEPENDENT_AMBULATORY_CARE_PROVIDER_SITE_OTHER): Payer: Self-pay

## 2013-03-28 VITALS — BP 134/90 | HR 72 | Temp 98.3°F | Resp 14 | Ht 66.0 in | Wt 160.2 lb

## 2013-03-28 DIAGNOSIS — C50919 Malignant neoplasm of unspecified site of unspecified female breast: Secondary | ICD-10-CM

## 2013-03-28 DIAGNOSIS — C50911 Malignant neoplasm of unspecified site of right female breast: Secondary | ICD-10-CM

## 2013-03-28 NOTE — Progress Notes (Signed)
NAME: Pamela EZELLE Entwistle       DOB: [birth year]           DATE: 03/28/2013       ZOX:[id number]  CC:  Chief Complaint  Patient presents with  . Follow-up    Discuss sx    HPI: Breast cancer, multifocal, right, sp/p lumpectomy/radiatioin many years ago for a right breast cncer. She returns to discuss options again and has apparently decided on a mastectomy without reconstruction. She has delayed for a few months agonizing over the decision. She has had no problems since last seen. I reviewed with her the risks of delaying surgery any further, as it has already been several months since diagnosis  PMH all reviewed in epic  EXAM: VITAL SIGNS:  BP 134/90  Pulse 72  Temp(Src) 98.3 F (36.8 C) (Temporal)  Resp 14  Ht 5\' 6"  (1.676 m)  Wt 160 lb 3.2 oz (72.666 kg)  BMI 25.87 kg/m2  GENERAL:  The patient is alert, oriented, and generally healthy-appearing, NAD. Mood and affect are normal.  HEENT:  The head is normocephalic, the eyes nonicteric, the pupils were round regular and equal. EOMs are normal. Pharynx normal. Dentition good.  NECK:  The neck is supple and there are no masses or thyromegaly.  LUNGS: Normal respirations and clear to auscultation.  HEART: Regular rhythm, with no murmurs rubs or gallops. Pulses are intact carotid dorsalis pedis and posterior tibial. No significant varicosities are noted.  BREASTS: S/P right lumpectomy and radiation. No obvious mass  LYMPHATICS: No axillary or supraclavicular adenopathy. She appears to have had an axillary dissection on the right  ABDOMEN: Soft, flat, and nontender. No masses or organomegaly is noted. No hernias are noted. Bowel sounds are normal.  EXTREMITIES:  Good range of motion, no edema.    IMP: Multifocal right breast cancer  PLAN: Right total mastectomy no reconstruction. Discussed risks etc with patient and she understands. 30 minutes of visit spent in counseling  Tysean Vandervliet J 03/28/2013

## 2013-03-28 NOTE — Patient Instructions (Signed)
We will schedule surgery a right mastectomy

## 2013-04-05 ENCOUNTER — Encounter (HOSPITAL_COMMUNITY): Payer: Self-pay | Admitting: Pharmacy Technician

## 2013-04-09 NOTE — Pre-Procedure Instructions (Signed)
SMT LOKEY  04/09/2013   Your procedure is scheduled on:  August 21  Report to [facility] Stay [facility] at 08:00 AM.  Call this number if you have problems the morning of surgery: [phone]   Remember:   Do not eat food or drink liquids after midnight.   Take these medicines the morning of surgery with A SIP OF WATER: Metoprolol   Do not take Aspirin, Aleve, Naproxen, Advil, Ibuprofen, Vitamin, Herbs, or Supplements starting today  Do not wear jewelry, make-up or nail polish.  Do not wear lotions, powders, or perfumes. You may wear deodorant.  Do not shave 48 hours prior to surgery. Men may shave face and neck.  Do not bring valuables to the hospital.  [facility] is not responsible                   for any belongings or valuables.  Contacts, dentures or bridgework may not be worn into surgery.  Leave suitcase in the car. After surgery it may be brought to your room.  For patients admitted to the hospital, checkout time is 11:00 AM the day of  discharge.   Patients discharged the day of surgery will not be allowed to drive  home.  Name and phone number of your driver: Family/ Friend  Special Instructions: Shower using CHG 2 nights before surgery and the night before surgery.  If you shower the day of surgery use CHG.  Use special wash - you have one bottle of CHG for all showers.  You should use approximately 1/3 of the bottle for each shower.   Please read over the following fact sheets that you were given: Pain Booklet, Coughing and Deep Breathing and Surgical Site Infection Prevention

## 2013-04-11 ENCOUNTER — Encounter (HOSPITAL_COMMUNITY): Payer: Self-pay

## 2013-04-11 ENCOUNTER — Encounter (HOSPITAL_COMMUNITY)
Admission: RE | Admit: 2013-04-11 | Discharge: 2013-04-11 | Disposition: A | Discharge status: Home / Self Care | Payer: Federal, State, Local not specified - PPO | Source: Ambulatory Visit | Attending: Surgery | Admitting: Surgery

## 2013-04-11 HISTORY — DX: Other complications of anesthesia, initial encounter: T88.59XA

## 2013-04-11 HISTORY — DX: Nausea with vomiting, unspecified: R11.2

## 2013-04-11 HISTORY — DX: Adverse effect of unspecified anesthetic, initial encounter: T41.45XA

## 2013-04-11 HISTORY — DX: Essential (primary) hypertension: I10

## 2013-04-11 HISTORY — DX: Other specified postprocedural states: Z98.890

## 2013-04-11 LAB — CBC WITH DIFFERENTIAL/PLATELET
Basophils Absolute: 0.1 10*3/uL (ref 0.0–0.1)
Basophils Relative: 1 % (ref 0–1)
Eosinophils Absolute: 0.3 10*3/uL (ref 0.0–0.7)
Eosinophils Relative: 4 % (ref 0–5)
HCT: 42.5 % (ref 36.0–46.0)
MCH: 25.8 pg — ABNORMAL LOW (ref 26.0–34.0)
MCHC: 32 g/dL (ref 30.0–36.0)
MCV: 80.5 fL (ref 78.0–100.0)
Monocytes Absolute: 0.4 10*3/uL (ref 0.1–1.0)
RDW: 14.3 % (ref 11.5–15.5)

## 2013-04-11 LAB — COMPREHENSIVE METABOLIC PANEL
AST: 21 U/L (ref 0–37)
Albumin: 3.9 g/dL (ref 3.5–5.2)
CO2: 27 mEq/L (ref 19–32)
Calcium: 9.7 mg/dL (ref 8.4–10.5)
Creatinine, Ser: 0.77 mg/dL (ref 0.50–1.10)
GFR calc non Af Amer: 88 mL/min — ABNORMAL LOW (ref >90)
Total Protein: 8.2 g/dL (ref 6.0–8.3)

## 2013-04-11 LAB — URINALYSIS, ROUTINE W REFLEX MICROSCOPIC
Hgb urine dipstick: NEGATIVE
Specific Gravity, Urine: 1.019 (ref 1.005–1.030)
Urobilinogen, UA: 0.2 mg/dL (ref 0.0–1.0)

## 2013-04-11 LAB — URINE MICROSCOPIC-ADD ON

## 2013-04-11 MED ORDER — CHLORHEXIDINE GLUCONATE 4 % EX LIQD: 1.0000 "application " | Freq: Once | CUTANEOUS | Status: DC

## 2013-04-13 MED ORDER — CEFAZOLIN SODIUM-DEXTROSE 2-3 GM-% IV SOLR: 2.0000 g | INTRAVENOUS | Status: DC

## 2013-04-14 ENCOUNTER — Encounter (HOSPITAL_COMMUNITY): Payer: Self-pay | Admitting: Anesthesiology

## 2013-04-14 ENCOUNTER — Ambulatory Visit (HOSPITAL_COMMUNITY): Payer: Federal, State, Local not specified - PPO | Admitting: Anesthesiology

## 2013-04-14 ENCOUNTER — Observation Stay (HOSPITAL_COMMUNITY)
Admission: RE | Admit: 2013-04-14 | Discharge: 2013-04-15 | Disposition: A | Discharge status: Home / Self Care | Payer: Federal, State, Local not specified - PPO | Source: Ambulatory Visit | Attending: Surgery | Admitting: Surgery

## 2013-04-14 ENCOUNTER — Encounter (HOSPITAL_COMMUNITY)
Admission: RE | Disposition: A | Discharge status: Home / Self Care | Payer: Self-pay | Source: Ambulatory Visit | Attending: Surgery

## 2013-04-14 DIAGNOSIS — Z853 Personal history of malignant neoplasm of breast: Secondary | ICD-10-CM

## 2013-04-14 DIAGNOSIS — C50911 Malignant neoplasm of unspecified site of right female breast: Secondary | ICD-10-CM

## 2013-04-14 DIAGNOSIS — D0590 Unspecified type of carcinoma in situ of unspecified breast: Secondary | ICD-10-CM

## 2013-04-14 HISTORY — PX: TOTAL MASTECTOMY: SHX6129

## 2013-04-14 SURGERY — MASTECTOMY, SIMPLE
Anesthesia: General | Site: Breast | Laterality: Right | Wound class: Clean

## 2013-04-14 MED ORDER — HYDROCHLOROTHIAZIDE 12.5 MG PO CAPS
12.5000 mg | ORAL_CAPSULE | Freq: Every day | ORAL | Status: DC
  Administered 2013-04-14: 12.5 mg via ORAL
  Filled 2013-04-14 (×2): qty 1

## 2013-04-14 MED ORDER — HEPARIN SODIUM (PORCINE) 5000 UNIT/ML IJ SOLN
5000.0000 [IU] | Freq: Three times a day (TID) | INTRAMUSCULAR | Status: DC
  Filled 2013-04-14 (×3): qty 1

## 2013-04-14 MED ORDER — HYDROMORPHONE HCL PF 1 MG/ML IJ SOLN: 0.2500 mg | INTRAMUSCULAR | Status: DC | PRN

## 2013-04-14 MED ORDER — CEFAZOLIN SODIUM-DEXTROSE 2-3 GM-% IV SOLR
2.0000 g | INTRAVENOUS | Status: AC
  Administered 2013-04-14: 2 g via INTRAVENOUS
  Filled 2013-04-14: qty 50

## 2013-04-14 MED ORDER — SCOPOLAMINE 1 MG/3DAYS TD PT72: 1.0000 | MEDICATED_PATCH | TRANSDERMAL | Status: DC

## 2013-04-14 MED ORDER — METOPROLOL TARTRATE 25 MG PO TABS
25.0000 mg | ORAL_TABLET | Freq: Two times a day (BID) | ORAL | Status: DC
  Administered 2013-04-14: 25 mg via ORAL
  Filled 2013-04-14 (×3): qty 1

## 2013-04-14 MED ORDER — PROPOFOL 10 MG/ML IV BOLUS
INTRAVENOUS | Status: DC | PRN
  Administered 2013-04-14: 170 mg via INTRAVENOUS
  Administered 2013-04-14: 30 mg via INTRAVENOUS

## 2013-04-14 MED ORDER — SCOPOLAMINE 1 MG/3DAYS TD PT72
1.0000 | MEDICATED_PATCH | TRANSDERMAL | Status: DC
  Administered 2013-04-14: 1.5 mg via TRANSDERMAL
  Filled 2013-04-14: qty 1

## 2013-04-14 MED ORDER — MORPHINE SULFATE 2 MG/ML IJ SOLN: 2.0000 mg | INTRAMUSCULAR | Status: DC | PRN

## 2013-04-14 MED ORDER — OXYCODONE HCL 5 MG PO TABS: 5.0000 mg | ORAL_TABLET | Freq: Once | ORAL | Status: DC | PRN

## 2013-04-14 MED ORDER — HYDROCHLOROTHIAZIDE 25 MG PO TABS: 12.5000 mg | ORAL_TABLET | Freq: Every day | ORAL | Status: DC

## 2013-04-14 MED ORDER — LIDOCAINE HCL (CARDIAC) 20 MG/ML IV SOLN
INTRAVENOUS | Status: DC | PRN
  Administered 2013-04-14: 100 mg via INTRAVENOUS

## 2013-04-14 MED ORDER — ONDANSETRON HCL 4 MG/2ML IJ SOLN
INTRAMUSCULAR | Status: DC | PRN
  Administered 2013-04-14: 4 mg via INTRAVENOUS

## 2013-04-14 MED ORDER — FENTANYL CITRATE 0.05 MG/ML IJ SOLN
INTRAMUSCULAR | Status: DC | PRN
  Administered 2013-04-14: 50 ug via INTRAVENOUS
  Administered 2013-04-14: 25 ug via INTRAVENOUS
  Administered 2013-04-14: 50 ug via INTRAVENOUS

## 2013-04-14 MED ORDER — LACTATED RINGERS IV SOLN
INTRAVENOUS | Status: DC | PRN
  Administered 2013-04-14 (×2): via INTRAVENOUS

## 2013-04-14 MED ORDER — PROMETHAZINE HCL 25 MG/ML IJ SOLN: 6.2500 mg | INTRAMUSCULAR | Status: DC | PRN

## 2013-04-14 MED ORDER — DEXAMETHASONE SODIUM PHOSPHATE 10 MG/ML IJ SOLN
INTRAMUSCULAR | Status: DC | PRN
  Administered 2013-04-14: 8 mg via INTRAVENOUS

## 2013-04-14 MED ORDER — ONDANSETRON HCL 4 MG/2ML IJ SOLN: 4.0000 mg | Freq: Four times a day (QID) | INTRAMUSCULAR | Status: DC | PRN

## 2013-04-14 MED ORDER — LACTATED RINGERS IV SOLN
INTRAVENOUS | Status: DC
  Administered 2013-04-14: 09:00:00 via INTRAVENOUS

## 2013-04-14 MED ORDER — OXYCODONE-ACETAMINOPHEN 5-325 MG PO TABS: 1.0000 | ORAL_TABLET | ORAL | Status: DC | PRN

## 2013-04-14 MED ORDER — MIDAZOLAM HCL 5 MG/5ML IJ SOLN
INTRAMUSCULAR | Status: DC | PRN
  Administered 2013-04-14: 2 mg via INTRAVENOUS

## 2013-04-14 MED ORDER — KCL IN DEXTROSE-NACL 20-5-0.45 MEQ/L-%-% IV SOLN
INTRAVENOUS | Status: DC
  Administered 2013-04-14 – 2013-04-15 (×2): via INTRAVENOUS
  Filled 2013-04-14 (×3): qty 1000

## 2013-04-14 MED ORDER — 0.9 % SODIUM CHLORIDE (POUR BTL) OPTIME
TOPICAL | Status: DC | PRN
  Administered 2013-04-14: 1000 mL

## 2013-04-14 MED ORDER — ARTIFICIAL TEARS OP OINT
TOPICAL_OINTMENT | OPHTHALMIC | Status: DC | PRN
  Administered 2013-04-14: 1 via OPHTHALMIC

## 2013-04-14 MED ORDER — OXYCODONE HCL 5 MG/5ML PO SOLN: 5.0000 mg | Freq: Once | ORAL | Status: DC | PRN

## 2013-04-14 MED ORDER — ONDANSETRON HCL 4 MG PO TABS: 4.0000 mg | ORAL_TABLET | Freq: Four times a day (QID) | ORAL | Status: DC | PRN

## 2013-04-14 SURGICAL SUPPLY — 53 items
ADH SKN CLS APL DERMABOND .7 (GAUZE/BANDAGES/DRESSINGS) ×1
APPLIER CLIP 9.375 MED OPEN (MISCELLANEOUS)
APPLIER CLIP 9.375 SM OPEN (CLIP)
APR CLP MED 9.3 20 MLT OPN (MISCELLANEOUS)
APR CLP SM 9.3 20 MLT OPN (CLIP)
BINDER BREAST LRG (GAUZE/BANDAGES/DRESSINGS) IMPLANT
BINDER BREAST MEDIUM (GAUZE/BANDAGES/DRESSINGS) ×1 IMPLANT
BINDER BREAST XLRG (GAUZE/BANDAGES/DRESSINGS) IMPLANT
BIOPATCH RED 1 DISK 7.0 (GAUZE/BANDAGES/DRESSINGS) ×3 IMPLANT
CANISTER SUCTION 2500CC (MISCELLANEOUS) ×2 IMPLANT
CHLORAPREP W/TINT 26ML (MISCELLANEOUS) ×2 IMPLANT
CLIP APPLIE 9.375 MED OPEN (MISCELLANEOUS) IMPLANT
CLIP APPLIE 9.375 SM OPEN (CLIP) IMPLANT
CLOTH BEACON ORANGE TIMEOUT ST (SAFETY) ×2 IMPLANT
COVER SURGICAL LIGHT HANDLE (MISCELLANEOUS) ×2 IMPLANT
DERMABOND ADVANCED (GAUZE/BANDAGES/DRESSINGS) ×1
DERMABOND ADVANCED .7 DNX12 (GAUZE/BANDAGES/DRESSINGS) IMPLANT
DRAIN CHANNEL 19F RND (DRAIN) ×2 IMPLANT
DRAPE CHEST BREAST 15X10 FENES (DRAPES) ×2 IMPLANT
DRAPE PROXIMA HALF (DRAPES) ×2 IMPLANT
DRAPE SURG 17X23 STRL (DRAPES) ×8 IMPLANT
DRAPE UTILITY 15X26 W/TAPE STR (DRAPE) ×4 IMPLANT
DRSG PAD ABDOMINAL 8X10 ST (GAUZE/BANDAGES/DRESSINGS) ×3 IMPLANT
DRSG TEGADERM 4X4.75 (GAUZE/BANDAGES/DRESSINGS) ×1 IMPLANT
ELECT BLADE 4.0 EZ CLEAN MEGAD (MISCELLANEOUS) ×2
ELECT CAUTERY BLADE 6.4 (BLADE) ×2 IMPLANT
ELECT REM PT RETURN 9FT ADLT (ELECTROSURGICAL) ×2
ELECTRODE BLDE 4.0 EZ CLN MEGD (MISCELLANEOUS) ×1 IMPLANT
ELECTRODE REM PT RTRN 9FT ADLT (ELECTROSURGICAL) ×1 IMPLANT
EVACUATOR SILICONE 100CC (DRAIN) ×2 IMPLANT
GLOVE BIOGEL PI IND STRL 7.0 (GLOVE) IMPLANT
GLOVE BIOGEL PI IND STRL 7.5 (GLOVE) IMPLANT
GLOVE BIOGEL PI INDICATOR 7.0 (GLOVE) ×1
GLOVE BIOGEL PI INDICATOR 7.5 (GLOVE) ×1
GLOVE EUDERMIC 7 POWDERFREE (GLOVE) ×2 IMPLANT
GLOVE SURG SS PI 7.0 STRL IVOR (GLOVE) ×1 IMPLANT
GLOVE SURG SS PI 7.5 STRL IVOR (GLOVE) ×1 IMPLANT
GOWN STRL NON-REIN LRG LVL3 (GOWN DISPOSABLE) ×4 IMPLANT
GOWN STRL REIN XL XLG (GOWN DISPOSABLE) ×2 IMPLANT
KIT BASIN OR (CUSTOM PROCEDURE TRAY) ×2 IMPLANT
KIT ROOM TURNOVER OR (KITS) ×2 IMPLANT
NS IRRIG 1000ML POUR BTL (IV SOLUTION) ×2 IMPLANT
PACK GENERAL/GYN (CUSTOM PROCEDURE TRAY) ×2 IMPLANT
PAD ARMBOARD 7.5X6 YLW CONV (MISCELLANEOUS) ×2 IMPLANT
SPECIMEN JAR X LARGE (MISCELLANEOUS) ×2 IMPLANT
SPONGE GAUZE 4X4 12PLY (GAUZE/BANDAGES/DRESSINGS) ×2 IMPLANT
STAPLER VISISTAT 35W (STAPLE) IMPLANT
SUT ETHILON 2 0 FS 18 (SUTURE) ×2 IMPLANT
SUT MNCRL AB 4-0 PS2 18 (SUTURE) ×1 IMPLANT
SUT VIC AB 3-0 SH 18 (SUTURE) ×2 IMPLANT
TAPE CLOTH SURG 4X10 WHT LF (GAUZE/BANDAGES/DRESSINGS) ×1 IMPLANT
TOWEL OR 17X24 6PK STRL BLUE (TOWEL DISPOSABLE) ×2 IMPLANT
TOWEL OR 17X26 10 PK STRL BLUE (TOWEL DISPOSABLE) ×2 IMPLANT

## 2013-04-14 NOTE — Anesthesia Preprocedure Evaluation (Signed)
Anesthesia Evaluation  Patient identified by MRN, date of birth, ID band Patient awake    Reviewed: Allergy & Precautions, H&P , NPO status , Patient's Chart, lab work & pertinent test results, reviewed documented beta blocker date and time   History of Anesthesia Complications (+) PONV  Airway Mallampati: II TM Distance: >3 FB Neck ROM: Full    Dental  (+) Teeth Intact   Pulmonary neg pulmonary ROS,    Pulmonary exam normal       Cardiovascular hypertension, Pt. on home beta blockers     Neuro/Psych negative neurological ROS  negative psych ROS   GI/Hepatic negative GI ROS, Neg liver ROS,   Endo/Other  negative endocrine ROS  Renal/GU negative Renal ROS     Musculoskeletal   Abdominal   Peds  Hematology negative hematology ROS (+)   Anesthesia Other Findings   Reproductive/Obstetrics                           Anesthesia Physical Anesthesia Plan  ASA: II  Anesthesia Plan: General   Post-op Pain Management:    Induction: Intravenous  Airway Management Planned: LMA  Additional Equipment:   Intra-op Plan:   Post-operative Plan: Extubation in OR  Informed Consent: I have reviewed the patients History and Physical, chart, labs and discussed the procedure including the risks, benefits and alternatives for the proposed anesthesia with the patient or authorized representative who has indicated his/her understanding and acceptance.   Dental advisory given  Plan Discussed with: CRNA, Anesthesiologist and Surgeon  Anesthesia Plan Comments:         Anesthesia Quick Evaluation

## 2013-04-14 NOTE — Transfer of Care (Signed)
Immediate Anesthesia Transfer of Care Note  Patient: Pamela Summers  Procedure(s) Performed: Procedure(s): TOTAL MASTECTOMY (Right)  Patient Location: PACU  Anesthesia Type:General  Level of Consciousness: awake, alert , oriented and sedated  Airway & Oxygen Therapy: Patient Spontanous Breathing and Patient connected to nasal cannula oxygen  Post-op Assessment: Report given to PACU RN, Post -op Vital signs reviewed and stable and Patient moving all extremities  Post vital signs: Reviewed and stable  Complications: No apparent anesthesia complications

## 2013-04-14 NOTE — Op Note (Signed)
MINDEE ROBLEDO Sep 30, 1950 [id number] 03/28/2013  Preoperative diagnosis: Right breast cancer, remote history of right breast cancer  Postoperative diagnosis: Same  Procedure: Right total mastectomy  Surgeon: Currie Paris   Anesthesia:General  Clinical History and Indications:The patient is seen in the holding area and we reviewed the plans for the procedure as noted above. We reviewed the risks and complications a final time. She had no further questions. I marked theright side as the operative side.  Description of Procedure: The patient was taken to the operating room. After satisfactory general anesthesia was obtained A full prep and drape was then done. The time out was done.  I outlined an elliptical incision and marked the inframammary fold and midline. The incision was made. The usual skin flaps were raised. I went to the clavicle superiorly and sternum medially and towards the latissimus laterally.  .  The inferior flap was then made going to the inframammary fold and out to the latissimus. The breast was then removed from the pectoralis starting medially and working laterally. When I got to the lateral aspect of the pectoralis major muscle I opened the clavipectoral fascia. I finished removing the breast from the serratus muscles. There was no palpable adenompath and the patient had a prior axillary dissection.   I then completed the mastectomy by dividing the remaining attachments from the breast to the chest wall and latissimus I spent several minutes irrigating and making sure everything was dry. I then placed a 33 [place] Blake drain through a small incision in the inferior flap and laid along the pectoralis muscle. It was secured with a 2-0 nylon suture.  Another irrigation and check for hemostasis was made. Everything appeared to be dry. Incision was then closed with interrupted 3-0 Vicryl and a running 4-0 Monocryl plus Dermabond  The patient tolerated the procedure  well. There were no operative complications. All counts were correct. Estimated blood loss was minimla. Sterile dressings were applied and the patient taken to the PACU in satisfactory condition.  Currie Paris, MD, FACS 04/14/2013 10:25 AM

## 2013-04-14 NOTE — Anesthesia Postprocedure Evaluation (Signed)
Anesthesia Post Note  Patient: Pamela Summers  Procedure(s) Performed: Procedure(s) (LRB): TOTAL MASTECTOMY (Right)  Anesthesia type: general  Patient location: PACU  Post pain: Pain level controlled  Post assessment: Patient's Cardiovascular Status Stable  Last Vitals:  Filed Vitals:   04/14/13 1233  BP: 124/68  Pulse: 65  Temp: 36.5 C  Resp: 16    Post vital signs: Reviewed and stable  Level of consciousness: sedated  Complications: No apparent anesthesia complications

## 2013-04-14 NOTE — H&P (View-Only) (Signed)
NAME: Pamela Summers       DOB: 09/29/1950           DATE: 03/28/2013       MRN:[id number]  CC:  Chief Complaint  Patient presents with  . Follow-up    Discuss sx    HPI: Breast cancer, multifocal, right, sp/p lumpectomy/radiatioin many years ago for a right breast cncer. She returns to discuss options again and has apparently decided on a mastectomy without reconstruction. She has delayed for a few months agonizing over the decision. She has had no problems since last seen. I reviewed with her the risks of delaying surgery any further, as it has already been several months since diagnosis  PMH all reviewed in epic  EXAM: VITAL SIGNS:  BP 134/90  Pulse 72  Temp(Src) 98.3 F (36.8 C) (Temporal)  Resp 14  Ht 5' 6" (1.676 m)  Wt 160 lb 3.2 oz (72.666 kg)  BMI 25.87 kg/m2  GENERAL:  The patient is alert, oriented, and generally healthy-appearing, NAD. Mood and affect are normal.  HEENT:  The head is normocephalic, the eyes nonicteric, the pupils were round regular and equal. EOMs are normal. Pharynx normal. Dentition good.  NECK:  The neck is supple and there are no masses or thyromegaly.  LUNGS: Normal respirations and clear to auscultation.  HEART: Regular rhythm, with no murmurs rubs or gallops. Pulses are intact carotid dorsalis pedis and posterior tibial. No significant varicosities are noted.  BREASTS: S/P right lumpectomy and radiation. No obvious mass  LYMPHATICS: No axillary or supraclavicular adenopathy. She appears to have had an axillary dissection on the right  ABDOMEN: Soft, flat, and nontender. No masses or organomegaly is noted. No hernias are noted. Bowel sounds are normal.  EXTREMITIES:  Good range of motion, no edema.    IMP: Multifocal right breast cancer  PLAN: Right total mastectomy no reconstruction. Discussed risks etc with patient and she understands. 30 minutes of visit spent in counseling  Antonetta Clanton J 03/28/2013   

## 2013-04-14 NOTE — Preoperative (Signed)
Beta Blockers   Reason not to administer Beta Blockers:Not Applicable 

## 2013-04-14 NOTE — Interval H&P Note (Signed)
History and Physical Interval Note:  04/14/2013 9:01 AM  Pamela Summers  has presented today for surgery, with the diagnosis of right breast cancer   The various methods of treatment have been discussed with the patient and family. After consideration of risks, benefits and other options for treatment, the patient has consented to  Procedure(s): TOTAL MASTECTOMY (Right) as a surgical intervention .  The patient's history has been reviewed, patient examined, no change in status, stable for surgery.  I have reviewed the patient's chart and labs.  Questions were answered to the patient's satisfaction.   I have marked the right breast as the operative site   [name] J

## 2013-04-15 ENCOUNTER — Telehealth (INDEPENDENT_AMBULATORY_CARE_PROVIDER_SITE_OTHER): Payer: Self-pay

## 2013-04-15 ENCOUNTER — Encounter (HOSPITAL_COMMUNITY): Payer: Self-pay | Admitting: Surgery

## 2013-04-15 MED ORDER — OXYCODONE-ACETAMINOPHEN 5-325 MG PO TABS: 1.0000 | ORAL_TABLET | ORAL | Status: DC | PRN

## 2013-04-15 NOTE — Progress Notes (Signed)
Pt discharged home; discharge instructions explained to pt and a copy of instructions were given to pt along w/ a prescription for percocet; pt also instructed on JP drain; pt verbalized understanding instructions.

## 2013-04-15 NOTE — Progress Notes (Signed)
1 Day Post-Op   Assessment: s/p Procedure(s): TOTAL MASTECTOMY Patient Active Problem List   Diagnosis Date Noted  . Breast cancer, right breast 10/15/2012    Priority: High  . Abnormal CXR 05/19/2012  . Personal history of malignant neoplasm of breast 08/26/1987    Doing well post mastectomy  Plan: Discharge  Subjective: Feels OK not much pain feels able [place] home  Objective: Vital signs in last 24 hours: Temp:  [97.6 F (36.4 C)-98.5 F (36.9 C)] 97.9 F (36.6 C) (08/22 0515) Pulse Rate:  [56-75] 59 (08/22 0515) Resp:  [11-20] 17 (08/22 0515) BP: (104-150)/(61-83) 104/61 mmHg (08/22 0515) SpO2:  [97 %-100 %] 97 % (08/22 0515) Weight:  [160 lb 3.2 oz (72.666 kg)] 160 lb 3.2 oz (72.666 kg) (08/21 1233)   Intake/Output from previous day: 08/21 0701 - 08/22 0700 In: 3026.3 [P.O.:240; I.V.:2771.3] Out: 1955 [Urine:1800; Drains:105; Blood:50]  General appearance: alert, cooperative and no distress Resp: clear to auscultation bilaterally  Incision: healing well, no significant drainage, JP thin  Lab Results:  No results found for this basename: WBC, HGB, HCT, PLT,  in the last 72 hours BMET No results found for this basename: NA, K, CL, CO2, GLUCOSE, BUN, CREATININE, CALCIUM,  in the last 72 hours  MEDS, Scheduled . heparin  5,000 Units Subcutaneous Q8H  . hydrochlorothiazide  12.5 mg Oral Daily  . metoprolol tartrate  25 mg Oral BID    Studies/Results: No results found.    LOS: 1 day     Currie Paris, MD, [facility], [place] [phone]   04/15/2013 7:34 AM

## 2013-04-15 NOTE — Telephone Encounter (Signed)
LMOM making pt aware of appt on 9/5 @ 930am

## 2013-04-16 NOTE — Progress Notes (Signed)
Quick Note:  Tell the patient that her margins are OK and no invasive cancer. I will discuss in detail in the office. ______

## 2013-04-16 NOTE — Discharge Summary (Signed)
Patient ID: Pamela Summers [id number] 62 y.o. [birth year]  Admission  Date: 04/14/2013  Discharge date and time: 04/15/2013  9:57 AM  Admitting Physician: Currie Paris  Discharge Physician: Currie Paris  Admission Diagnoses: right breast cancer   Discharge Diagnoses: Same, DCIS  Operations: Procedure(s): TOTAL MASTECTOMY  Discharged Condition: good  Hospital Course: She was kept in overnight for observation following total mastectomy. She did well and was able to be discharged the next morning  Consults: None  Significant Diagnostic Studies: Path: FINAL DIAGNOSIS Diagnosis Breast, simple mastectomy, Right - DUCTAL CARCINOMA IN SITU WITH NECROSIS AND CALCIFICATIONS. - MARGINS NOT INVOLVED. - CLOSEST MARGIN POSTERIOR, 1.2 CM. Microscopic Comment BREAST, IN SITU CARCINOMA Specimen, including laterality: Right breast Procedure: Mastectomy Grade of carcinoma: High grade Necrosis: Present Estimated tumor size: (gross measurement): .2.3 cm Treatment effect: No If present, treatment effect in breast tissue, lymph nodes or both: N/A Distance to closest margin: 1.2 cm from deep margin If margin positive, focally or broadly: N/A Breast prognostic profile: Case #[id number] Estrogen receptor: 13%, weak staining Progesterone receptor: 3%, weak staining Lymph nodes: Examined: 0 Lymph nodes with metastasis: N/A Isolated tumor cells (< 0.2 mm): N/A Micrometastasis ( > 0.2 mm and < 2.0 mm): N/A Macrometastasis (> 2.0 mm): N/A Extranodal extension: N/A TNM: pTis, pNX (JDP:caf 04/15/13) Jimmy Picket MD Pathologist, Electronic Signature   Disposition: Home

## 2013-04-19 ENCOUNTER — Telehealth (INDEPENDENT_AMBULATORY_CARE_PROVIDER_SITE_OTHER): Payer: Self-pay

## 2013-04-19 NOTE — Telephone Encounter (Signed)
LMOM for pt asking her to call office for path results; margins OK no invasive cancer.  Dr Jamey Ripa will discuss in detail in office on 9/5

## 2013-04-20 ENCOUNTER — Encounter (INDEPENDENT_AMBULATORY_CARE_PROVIDER_SITE_OTHER): Payer: Federal, State, Local not specified - PPO

## 2013-04-20 NOTE — Telephone Encounter (Signed)
Patient called back, I let her know path came back with ok margins.

## 2013-04-28 ENCOUNTER — Other Ambulatory Visit (INDEPENDENT_AMBULATORY_CARE_PROVIDER_SITE_OTHER): Payer: Self-pay | Admitting: *Deleted

## 2013-04-28 DIAGNOSIS — C50919 Malignant neoplasm of unspecified site of unspecified female breast: Secondary | ICD-10-CM

## 2013-04-28 MED ORDER — UNABLE TO FIND: Status: DC

## 2013-04-29 ENCOUNTER — Ambulatory Visit (INDEPENDENT_AMBULATORY_CARE_PROVIDER_SITE_OTHER): Payer: Federal, State, Local not specified - PPO | Admitting: Surgery

## 2013-04-29 ENCOUNTER — Encounter (INDEPENDENT_AMBULATORY_CARE_PROVIDER_SITE_OTHER): Payer: Self-pay | Admitting: Surgery

## 2013-04-29 VITALS — BP 138/100 | HR 71 | Temp 99.4°F | Ht 66.0 in | Wt 161.8 lb

## 2013-04-29 DIAGNOSIS — C50919 Malignant neoplasm of unspecified site of unspecified female breast: Secondary | ICD-10-CM

## 2013-04-29 DIAGNOSIS — C50911 Malignant neoplasm of unspecified site of right female breast: Secondary | ICD-10-CM

## 2013-04-29 DIAGNOSIS — Z09 Encounter for follow-up examination after completed treatment for conditions other than malignant neoplasm: Secondary | ICD-10-CM

## 2013-04-29 NOTE — Progress Notes (Signed)
Pamela Summers    [id number] 04/29/2013    May 05, 1951   CC:   Chief Complaint  Patient presents with  . Routine Post Op    Rt br masty     HPI:  The patient returns for post op follow-up. She underwent a right total mastectomy on 04/14/2013. Over all she feels that she is doing well. No issues  PE: VITAL SIGNS: BP 138/100  Pulse 71  Temp(Src) 99.4 F (37.4 C) (Oral)  Ht 5\' 6"  (1.676 m)  Wt 161 lb 12.8 oz (73.392 kg)  BMI 26.13 kg/m2  SpO2 98%  Breast: The incision is healing nicely and there is no evidence of infection or hematoma.  The drain is still about 40 cc/day.  DATA REVIEWED: Pathology report: Diagnosis Breast, simple mastectomy, Right - DUCTAL CARCINOMA IN SITU WITH NECROSIS AND CALCIFICATIONS. - MARGINS NOT INVOLVED. - CLOSEST MARGIN POSTERIOR, 1.2 CM. Microscopic Comment BREAST, IN SITU CARCINOMA Specimen, including laterality: Right breast Procedure: Mastectomy Grade of carcinoma: High grade Necrosis: Present Estimated tumor size: (gross measurement): .2.3 cm Treatment effect: No If present, treatment effect in breast tissue, lymph nodes or both: N/A Distance to closest margin: 1.2 cm from deep margin If margin positive, focally or broadly: N/A Breast prognostic profile: Case #[id number] Estrogen receptor: 13%, weak staining Progesterone receptor: 3%, weak staining Lymph nodes: Examined: 0 Lymph nodes with metastasis: N/A Isolated tumor cells (< 0.2 mm): N/A Micrometastasis ( > 0.2 mm and < 2.0 mm): N/A Macrometastasis (> 2.0 mm): N/A Extranodal extension: N/A TNM: pTis, pNX (JDP:caf 04/15/13) Jimmy Picket MD Pathologist, Electronic Signature (Case signed 04/15/2013)  IMPRESSION: Patient doing well. Drain likely out next week  PLAN: Her next visit will be in a few days to see the nurse to get the drain out. I gave the patient a copy of the pathology report and reviewed it with her .

## 2013-04-29 NOTE — Patient Instructions (Addendum)
We need to see you next week to get the drain out. The drainage to be less than 30 cc over a 24-hour period in order to be able to remove it.

## 2013-05-03 ENCOUNTER — Ambulatory Visit (INDEPENDENT_AMBULATORY_CARE_PROVIDER_SITE_OTHER): Payer: Federal, State, Local not specified - PPO

## 2013-05-03 DIAGNOSIS — Z5189 Encounter for other specified aftercare: Secondary | ICD-10-CM

## 2013-05-04 NOTE — Progress Notes (Signed)
Late entry- Patient came into office on 05/03/13 for drain removal.  Patient informed me that her drain output when last emptied was 30 cc's.  Patient output drain amount today was 25cc's.  Patient was given appointment for Thursday 05/05/13 @ 10:00 for possible drain removal. However, the patient has been advised to call our office if her drain output continues to be above 20cc's so we can r/s her appointment to another day.  Drain output must be below 20cc's for 2-3 consecutive days before drain removal can take place or unless otherwise ordered per Surgeon.  Patient agrees to plan as above and verbalized understanding.

## 2013-05-05 ENCOUNTER — Ambulatory Visit (INDEPENDENT_AMBULATORY_CARE_PROVIDER_SITE_OTHER): Payer: Federal, State, Local not specified - PPO

## 2013-05-05 DIAGNOSIS — Z4889 Encounter for other specified surgical aftercare: Secondary | ICD-10-CM

## 2013-05-05 DIAGNOSIS — Z4803 Encounter for change or removal of drains: Secondary | ICD-10-CM

## 2013-05-05 NOTE — Progress Notes (Signed)
Patient comes into office today for drain removal.  Patient reports that her drain output has been less than 25cc's over the last 36 hours.  Removed suture, then proceeded to remove JP Drain.  Patient tolerated well.  Placed dry gauze over drain site.  Patient given post op appointment for 05/27/13 @ 10:30 w/Dr. Jamey Ripa.  Patient advised to call our office if she develops an increase in drainage, redness, swelling or fever.  Patient verbalized understanding.

## 2013-05-27 ENCOUNTER — Encounter (INDEPENDENT_AMBULATORY_CARE_PROVIDER_SITE_OTHER): Payer: Self-pay | Admitting: Surgery

## 2013-05-27 ENCOUNTER — Ambulatory Visit (INDEPENDENT_AMBULATORY_CARE_PROVIDER_SITE_OTHER): Payer: Federal, State, Local not specified - PPO | Admitting: Surgery

## 2013-05-27 VITALS — BP 128/80 | HR 68 | Temp 97.4°F | Resp 14 | Ht 66.0 in | Wt 165.4 lb

## 2013-05-27 DIAGNOSIS — Z09 Encounter for follow-up examination after completed treatment for conditions other than malignant neoplasm: Secondary | ICD-10-CM

## 2013-05-27 NOTE — Patient Instructions (Signed)
Continue annual mammograms - next due in February. See [place] in six months We will arrange an oncology consult      [facility] CLASS After Breast Cancer Class  After Breast Cancer Class is a specially designed exercise class to assist you in a safe recovery after having breast cancer surgery.  In this class you will learn how to get back to full function whether your drains were just removed or if you had surgery a month ago.  This one-time class is held the 1st and 3rd Monday of every month from 11:00 a.m. until 12:00 noon at the [facility] located at [facility].  This class is FREE and space is limited.  For more information or to register for the next available class, call [phone].  Class Goals   Understand specific stretches to improve the flexibility of your chest and shoulder.   Learn ways to safely strengthen your upper body and improve your posture.   Understand the warning signs of infection and why you may be at risk for an arm infection.   Learn about Lymphedema and prevention.  **You do not attend this class until after surgery.  Drains must be removed to participate.     Micheline Maze, PT, CLT Dwaine Gale, PT, CLT

## 2013-05-27 NOTE — Progress Notes (Signed)
Pamela Summers    [id number] 05/27/2013    23-Jan-1951   CC:   Chief Complaint  Patient presents with  . Routine Post Op    po mastectomy     HPI:  The patient returns for post op follow-up. She underwent a right total mastectomy on 04/14/2013. Over all she feels that she is doing well. No issues Drains are out. She has not seen an oncologist  PE: VITAL SIGNS: BP 128/80  Pulse 68  Temp(Src) 97.4 F (36.3 C) (Temporal)  Resp 14  Ht 5\' 6"  (1.676 m)  Wt 165 lb 6.4 oz (75.025 kg)  BMI 26.71 kg/m2  Breast: The incision is healing nicely and there is no evidence of infection or hematoma.Tiny seroma    DATA REVIEWED: Pathology report: Diagnosis Breast, simple mastectomy, Right - DUCTAL CARCINOMA IN SITU WITH NECROSIS AND CALCIFICATIONS. - MARGINS NOT INVOLVED. - CLOSEST MARGIN POSTERIOR, 1.2 CM. Microscopic Comment BREAST, IN SITU CARCINOMA Specimen, including laterality: Right breast Procedure: Mastectomy Grade of carcinoma: High grade Necrosis: Present Estimated tumor size: (gross measurement): .2.3 cm Treatment effect: No If present, treatment effect in breast tissue, lymph nodes or both: N/A Distance to closest margin: 1.2 cm from deep margin If margin positive, focally or broadly: N/A Breast prognostic profile: Case #[id number] Estrogen receptor: 13%, weak staining Progesterone receptor: 3%, weak staining Lymph nodes: Examined: 0 Lymph nodes with metastasis: N/A Isolated tumor cells (< 0.2 mm): N/A Micrometastasis ( > 0.2 mm and < 2.0 mm): N/A Macrometastasis (> 2.0 mm): N/A Extranodal extension: N/A TNM: pTis, pNX (JDP:caf 04/15/13) Jimmy Picket MD Pathologist, Electronic Signature (Case signed 04/15/2013)  IMPRESSION: Patient doing well/ PLAN: Aspirated 6 cc ABC referral Oncology referral RTC 6 months, mammogram in February.

## 2013-06-29 ENCOUNTER — Other Ambulatory Visit: Payer: Self-pay | Admitting: Emergency Medicine

## 2013-06-30 ENCOUNTER — Telehealth (INDEPENDENT_AMBULATORY_CARE_PROVIDER_SITE_OTHER): Payer: Self-pay

## 2013-06-30 DIAGNOSIS — C50911 Malignant neoplasm of unspecified site of right female breast: Secondary | ICD-10-CM

## 2013-06-30 NOTE — Addendum Note (Signed)
Addended byLiliana Cline on: 06/30/2013 02:07 PM   Modules accepted: Orders

## 2013-06-30 NOTE — Telephone Encounter (Signed)
Spoke with patient and made her aware Dr Tenna Child recommendations. She is aware that breast pain is not a sign of cancer, but she states she did have it when she was diagnosed so it makes her nervous. She will do whatever Dr Jamey Ripa recommends. She has not heard from medical oncology. Per her last office note she was to be referred but it does not look like this was ever entered. Referral entered now. She is to call back if she does not hear from them.

## 2013-06-30 NOTE — Telephone Encounter (Signed)
Pt calling in b/c she is still concerned about the left breast causing her constant pain. The pt had a right mastectomy in August 2014 but she is just very concerned with the pain. The pt is wanting to see if Dr Jamey Ripa would order a breast mri on the left side for peace of mind. The pt didn't know if you would need to see her again or if you would just order this MRI. Please advise.

## 2013-06-30 NOTE — Telephone Encounter (Signed)
She had an MRI in Feb, so will not be able to have another until February.She would need a mammogram first. Best to have the mammogram when scheduled, then get the MRI

## 2013-07-07 ENCOUNTER — Telehealth: Payer: Self-pay | Admitting: *Deleted

## 2013-07-07 NOTE — Telephone Encounter (Signed)
Called pt to schedule her for a med onc appt she informed me that she would like to see Dr. Darnelle Catalan and would need an am appt on Monday because of her work schedule.  Informed her that I would have to go to Dr. Darnelle Catalan to see how we can accommodate her with this request and I would call her back.

## 2013-07-08 ENCOUNTER — Encounter: Payer: Self-pay | Admitting: *Deleted

## 2013-07-08 NOTE — Progress Notes (Signed)
Gave paperwork to Dr. Darnelle Catalan for a date and time to schedule this pt.

## 2013-07-11 ENCOUNTER — Other Ambulatory Visit: Payer: Self-pay | Admitting: Oncology

## 2013-07-12 ENCOUNTER — Telehealth: Payer: Self-pay | Admitting: *Deleted

## 2013-07-12 NOTE — Telephone Encounter (Signed)
Received date and time from Dr. Darnelle Catalan.  Called and left a message for pt to return my call so I can schedule her.

## 2013-07-13 ENCOUNTER — Telehealth: Payer: Self-pay | Admitting: *Deleted

## 2013-07-13 NOTE — Telephone Encounter (Signed)
Pt returned my call and I confirmed 08/01/13 appt w/ pt.  Mailed before appt letter, welcome packet & intake form to pt.  Emailed Jade at [facility] to make her aware.  Took paperwork to Med Rec for chart.

## 2013-07-15 ENCOUNTER — Encounter: Payer: Self-pay | Admitting: *Deleted

## 2013-07-15 NOTE — Progress Notes (Signed)
Completed chart and gave to Dawn to enter labs and give back to me so I can give to Dr. Magrinat.  

## 2013-07-18 ENCOUNTER — Other Ambulatory Visit: Payer: Self-pay | Admitting: *Deleted

## 2013-07-18 ENCOUNTER — Encounter: Payer: Self-pay | Admitting: *Deleted

## 2013-07-18 DIAGNOSIS — C50211 Malignant neoplasm of upper-inner quadrant of right female breast: Secondary | ICD-10-CM

## 2013-07-18 NOTE — Progress Notes (Signed)
Received chart back and placed in Dr. Magrinat's box. 

## 2013-08-01 ENCOUNTER — Telehealth: Payer: Self-pay | Admitting: *Deleted

## 2013-08-01 ENCOUNTER — Ambulatory Visit: Payer: Federal, State, Local not specified - PPO

## 2013-08-01 ENCOUNTER — Ambulatory Visit (HOSPITAL_BASED_OUTPATIENT_CLINIC_OR_DEPARTMENT_OTHER): Payer: Federal, State, Local not specified - PPO | Admitting: Oncology

## 2013-08-01 ENCOUNTER — Encounter (INDEPENDENT_AMBULATORY_CARE_PROVIDER_SITE_OTHER): Payer: Self-pay

## 2013-08-01 VITALS — BP 145/90 | HR 80 | Temp 98.7°F | Resp 18 | Ht 66.0 in | Wt 164.1 lb

## 2013-08-01 DIAGNOSIS — C50219 Malignant neoplasm of upper-inner quadrant of unspecified female breast: Secondary | ICD-10-CM

## 2013-08-01 DIAGNOSIS — C50211 Malignant neoplasm of upper-inner quadrant of right female breast: Secondary | ICD-10-CM

## 2013-08-01 DIAGNOSIS — Z170 Estrogen receptor positive status [ER+]: Secondary | ICD-10-CM

## 2013-08-01 NOTE — Progress Notes (Signed)
ID: Pamela Summers OB: 1951-08-04  MR#: [id number]  WUJ#:[id number]  PCP: Johny Blamer, MD GYN:  Teodora Medici SU: [facility] OTHER MD: Cain Saupe,  Trey Paula Medoff   CHIEF COMPLAINT: "I had another breast cancer"  HISTORY OF PRESENT ILLNESS: The patient has a history of right upper outer quadrant breast cancer status post lumpectomy and axillary lymph node dissection in 1989, when the patient was 62 years old. She "met with a chemo doctor" but did not receive adjuvant chemotherapy or antiestrogens. She underwent adjuvant radiation under Dr Chipper Herb. I do not have those records.  On 09/22/2011 bilateral screening mammography at the breast Center showed a possible mass in the left breast additional studies felt this to have been a summation shadow where there was no palpable mass and ultrasound showed only normal tissue. Nevertheless left diagnostic mammography in 6 months was recommended and that was performed in August of 2013. This was again negative.  On 09/27/2012 the patient had bilateral screening mammography showing a possible mass in the right breast. Right diagnostic mammography and right breast ultrasound on 10/04/2012 showed a 7 mm ovoid density which by ultrasound measured 5 mm, at the 2:00 position in the right breast. The right axilla was unremarkable.  Ultrasound-guided biopsy of this mass to 06/13/2013 showed ([id number]) a ductal carcinoma in situ, grade 2 or 3, estrogen receptors 13% positive, with weak staining intensity, and progesterone receptor 3% positive, with weak staining intensity.  On 10/10/2012 she underwent bilateral breast MRI which showed a 2.4 cm area of clumped linear enhancement surrounding the recent biopsy clip there were no other suspicious areas in the right breast, and no findings of concern in the left breast or regional lymph node area.  With this information and after appropriate discussion the patient proceeded to right simple  mastectomy 04/14/2013. The final pathology from that procedure ([id number]) showed an area of 2.3 cm of ductal carcinoma in situ, high-grade. Margins were ample (1.2 cm from the deep margin was the closest).  The patient's subsequent history is as detailed below  INTERVAL HISTORY: Pamela Summers was seen at the breast clinic 08/01/2013.  REVIEW OF SYSTEMS: She did well with her surgery, without unusual pain, fever, dehiscence, swelling, erythema, or bleeding. She tells me her hearing is not as good as it used to be. She has partial dentures. She has a history of heart murmur, "but it's benign". She has a little bit of soreness in her left breast and wonders if the cancer can go there from the right breast. Otherwise a detailed review of systems was noncontributory. She does not exercise regularly but what my work is physical".  PAST MEDICAL HISTORY: Past Medical History  Diagnosis Date  . Arthritis   . Heart murmur   . Cancer   . Hearing loss   . Breast cancer 1989, 2014  . Hypertension   . Complication of anesthesia     pt states"difficult to wake up"  . PONV (postoperative nausea and vomiting)     "scop patch placed and does not work"    PAST SURGICAL HISTORY: Past Surgical History  Procedure Laterality Date  . Abdominal hysterectomy      partical  . Breast surgery  1990    lumpectomy - right  . Ganglion cyst excision  2002 - approximate  . Stapedes surgery Right 2011  . Total mastectomy Right 04/14/2013    Procedure: TOTAL MASTECTOMY;  Surgeon: Currie Paris, MD;  Location: [facility] OR;  Service: General;  Laterality: Right;    FAMILY HISTORY Family History  Problem Relation Age of Onset  . Cancer Mother     uterine or ovarian cancer  . Heart disease Father   . Cancer Sister     maternal half sister with uterine or cervical cancer; died in her 63s  . Cancer Maternal Uncle     unknown cancer  . Cancer Cousin     3 maternal cousins with unknown cancers   the family history is  detailed extensively in the genetics consult in brief: The patient has very little information about her father. Her mother died before the age of 65 from either uterine or cervical cancer. The patient had 6 maternal half siblings and 4 paternal half siblings. There is no history of breast or ovarian cancer in the family as far as she can tell  GYNECOLOGIC HISTORY:  Menarche age 24, first live birth age 19, the patient is GX P1. She status post simple hysterectomy, without salpingo-oophorectomy. She did not use hormone replacement.  SOCIAL HISTORY:   [name] works as a Chiropractor, third shift, for the [employer]. She lives by herself, with no pets. Her daughter Pamela Summers lives in [place] and works in a Furniture conservator/restorer. The patient has 2 grandchildren. She attends a [employer]     ADVANCED DIRECTIVES:  in place. The patient's daughter is her healthcare power of attorney. Steward Drone can be reached at [phone]    HEALTH MAINTENANCE: History  Substance Use Topics  . Smoking status: Never Smoker   . Smokeless tobacco: Never Used  . Alcohol Use: No     Colonoscopy: 2010?/ [facility]  PAP: Status post hysterectomy   Bone density: At Dr.Mezer's/ "normal" and he  Lipid panel:  No Known Allergies  Current Outpatient Prescriptions  Medication Sig Dispense Refill  . hydrochlorothiazide (HYDRODIURIL) 12.5 MG tablet Take 12.5 mg by mouth daily.      Marland Kitchen HYDROcodone-acetaminophen (NORCO/VICODIN) 5-325 MG per tablet       . metoprolol tartrate (LOPRESSOR) 25 MG tablet Take 1 tablet (25 mg total) by mouth 2 (two) times daily. PATIENT NEEDS OFFICE VISIT FOR ADDITIONAL REFILLS  60 tablet  0  . UNABLE TO FIND Rx: Z6109- Post Mastectomy Garment (Quantity: 2) Dx: 174.9- Right Mastectomy  1 each  0   No current facility-administered medications for this visit.    OBJECTIVE: Middle-aged [place] American woman in no acute distress BP  Filed Vitals:   08/01/13 0826  BP: 145/90   Pulse: 80  Temp: 98.7 F (37.1 C)  Resp: 18     Body mass index is 26.5 kg/(m^2).    ECOG FS:0 - Asymptomatic  Ocular: Sclerae unicteric, pupils equal and round Ear-nose-throat: Oropharynx clear, partial dentures in place Lymphatic: No cervical or supraclavicular adenopathy Lungs no rales or rhonchi, good excursion bilaterally Heart regular rate and rhythm, no murmur appreciated Abd soft, nontender, positive bowel sounds MSK no focal spinal tenderness, no joint edema Neuro: non-focal, well-oriented, appropriate affect Breasts: The right breast is status post mastectomy. There is no dehiscence, erythema, unusual tenderness or swelling. The right axilla is benign. The left breast is unremarkable.   LAB RESULTS:  CMP     Component Value Date/Time   NA 139 04/11/2013 1008   K 3.6 04/11/2013 1008   CL 102 04/11/2013 1008   CO2 27 04/11/2013 1008   GLUCOSE 96 04/11/2013 1008   BUN 8 04/11/2013 1008   CREATININE 0.77 04/11/2013 1008  CALCIUM 9.7 04/11/2013 1008   PROT 8.2 04/11/2013 1008   ALBUMIN 3.9 04/11/2013 1008   AST 21 04/11/2013 1008   ALT 17 04/11/2013 1008   ALKPHOS 76 04/11/2013 1008   BILITOT 0.3 04/11/2013 1008   GFRNONAA 88* 04/11/2013 1008   GFRAA >90 04/11/2013 1008    I No results found for this basename: SPEP,  UPEP,   kappa and lambda light chains    Lab Results  Component Value Date   WBC 8.0 04/11/2013   NEUTROABS 4.1 04/11/2013   HGB 13.6 04/11/2013   HCT 42.5 04/11/2013   MCV 80.5 04/11/2013   PLT 299 04/11/2013      Chemistry      Component Value Date/Time   NA 139 04/11/2013 1008   K 3.6 04/11/2013 1008   CL 102 04/11/2013 1008   CO2 27 04/11/2013 1008   BUN 8 04/11/2013 1008   CREATININE 0.77 04/11/2013 1008      Component Value Date/Time   CALCIUM 9.7 04/11/2013 1008   ALKPHOS 76 04/11/2013 1008   AST 21 04/11/2013 1008   ALT 17 04/11/2013 1008   BILITOT 0.3 04/11/2013 1008       No results found for this basename: LABCA2    No components found with  this basename: [id number]    No results found for this basename: INR,  in the last 168 hours  Urinalysis    Component Value Date/Time   COLORURINE YELLOW 04/11/2013 0959   APPEARANCEUR CLEAR 04/11/2013 0959   LABSPEC 1.019 04/11/2013 0959   PHURINE 7.5 04/11/2013 0959   GLUCOSEU NEGATIVE 04/11/2013 0959   HGBUR NEGATIVE 04/11/2013 0959   BILIRUBINUR NEGATIVE 04/11/2013 0959   BILIRUBINUR neg 12/22/2012 1310   KETONESUR NEGATIVE 04/11/2013 0959   PROTEINUR NEGATIVE 04/11/2013 0959   UROBILINOGEN 0.2 04/11/2013 0959   UROBILINOGEN 0.2 12/22/2012 1310   NITRITE NEGATIVE 04/11/2013 0959   NITRITE neg 12/22/2012 1310   LEUKOCYTESUR TRACE* 04/11/2013 0959    STUDIES: No results found. Left mammography is due February 2015  ASSESSMENT: 62 y.o. BRCA negative [place] woman  (1) status post right lumpectomy and axillary lymph node dissection in 1989 for an upper outer quadrant invasive carcinoma, status post radiation, no chemotherapy or antiestrogen therapy given  (2) status post right simple mastectomy 04/14/2013  for in upper inner quadrant 2.3 cm ductal carcinoma in situ, high-grade, estrogen receptor 13% and progesterone receptors 3% positive, both weakly staining, with ample margins  (3) the patient opted against reconstruction  PLAN: We spent the better part of today's hour-long appointment discussing the biology of breast cancer in general, and the specifics of the patient's tumor in particular. Geroldine understands that the cancer cells in her case works wrapped in the milk ducts, and could not travel to vital organ. Accordingly this was not a life-threatening cancer. Furthermore, since the cancer cells were limited to the right breast and the entire right breast was removed, this cancer is essentially cured.  She does have a one half of 1% per year chance of developing another, new breast cancer in the opposite breast. If she took anti-estrogens for 5 years, that risk would drop from 10% to  approximately 5%. We discussed the possible toxicities, side effects and complications of anti-estrogens. The patient is not interested in this very small risk reduction at the cost of the possible side effects.  Accordingly she will be followed with observation alone. She tells me she will be seeing Dr. Dwain Sarna since Dr. Jamey Ripa is  retiring, and if she sees him in March as she can see [place] in September. We will follow her for a total of 5 years.  I reviewed the patient's genetics as well. I have suggested her daughter start receiving mammography on a yearly basis at this point. This is already being done.  Dailey has a good understanding of the overall plan. She agrees with it. She understands the goal of her treatment is cure. She knows to call for any problems that develop before next visit here.   Lowella Dell, MD   08/01/2013 9:08 AM

## 2013-08-01 NOTE — Telephone Encounter (Signed)
appts made and printed. Pt is aware that [facility] has her on their call list for 09/2013...td

## 2013-08-02 ENCOUNTER — Other Ambulatory Visit: Payer: Self-pay | Admitting: Emergency Medicine

## 2013-08-03 ENCOUNTER — Ambulatory Visit: Payer: Federal, State, Local not specified - PPO | Admitting: Oncology

## 2013-08-08 ENCOUNTER — Encounter: Payer: Self-pay | Admitting: *Deleted

## 2013-08-08 NOTE — Progress Notes (Signed)
Mailed after appt letter to pt. 

## 2013-09-06 ENCOUNTER — Other Ambulatory Visit: Payer: Self-pay

## 2013-09-06 DIAGNOSIS — Z1231 Encounter for screening mammogram for malignant neoplasm of breast: Secondary | ICD-10-CM

## 2013-09-06 DIAGNOSIS — Z9011 Acquired absence of right breast and nipple: Secondary | ICD-10-CM

## 2013-09-27 ENCOUNTER — Encounter: Payer: Self-pay | Admitting: Genetic Counselor

## 2013-09-27 ENCOUNTER — Encounter (INDEPENDENT_AMBULATORY_CARE_PROVIDER_SITE_OTHER): Payer: Self-pay

## 2013-09-27 ENCOUNTER — Telehealth: Payer: Self-pay | Admitting: Genetic Counselor

## 2013-09-27 NOTE — Telephone Encounter (Signed)
Revealed that teh BARD1 and CDKN2A VUS that were found in her genetic testing last year have been reclassified as benign.  We will send a letter with the amended report to her.

## 2013-10-03 ENCOUNTER — Ambulatory Visit
Admission: RE | Admit: 2013-10-03 | Discharge: 2013-10-03 | Disposition: A | Discharge status: Home / Self Care | Payer: Federal, State, Local not specified - PPO | Source: Ambulatory Visit

## 2013-10-03 DIAGNOSIS — Z1231 Encounter for screening mammogram for malignant neoplasm of breast: Secondary | ICD-10-CM

## 2013-10-03 DIAGNOSIS — Z9011 Acquired absence of right breast and nipple: Secondary | ICD-10-CM

## 2013-11-14 ENCOUNTER — Encounter (INDEPENDENT_AMBULATORY_CARE_PROVIDER_SITE_OTHER): Payer: Self-pay | Admitting: General Surgery

## 2013-11-14 ENCOUNTER — Ambulatory Visit (INDEPENDENT_AMBULATORY_CARE_PROVIDER_SITE_OTHER): Payer: Federal, State, Local not specified - PPO | Admitting: General Surgery

## 2013-11-14 VITALS — BP 130/72 | HR 75 | Temp 99.0°F | Resp 16 | Ht 66.0 in | Wt 168.0 lb

## 2013-11-14 DIAGNOSIS — C50211 Malignant neoplasm of upper-inner quadrant of right female breast: Secondary | ICD-10-CM

## 2013-11-14 DIAGNOSIS — C50219 Malignant neoplasm of upper-inner quadrant of unspecified female breast: Secondary | ICD-10-CM

## 2013-11-14 NOTE — Progress Notes (Signed)
Subjective:     Patient ID: Pamela Summers, female   DOB: [birth year], 63 y.o.   MRN: [id number]  HPI This is a 63 year old female patient of Dr. Margot Chimes who in 1989 underwent a right lumpectomy axillary node dissection followed by radiotherapy for invasive breast cancer. She then had a recurrence and in August of 2014 show a right total mastectomy for ductal carcinoma in situ. She is being seen today in followup. She underwent a left mammogram in February 2015 which is negative and recommended for a one year followup. She has no real complaints today. She has no complaint of any arm swelling or shoulder issues. She feels no masses.  Review of Systems EXAM:  DIGITAL SCREENING UNILATERAL LEFT MAMMOGRAM WITH CAD  COMPARISON: Previous exam(s).  ACR Breast Density Category b: There are scattered areas of  fibroglandular density.  FINDINGS:  There are no findings suspicious for malignancy. Images were  processed with CAD.  IMPRESSION:  No mammographic evidence of malignancy. A result letter of this  screening mammogram will be mailed directly to the patient.  RECOMMENDATION:  Screening mammogram in one year. (Code:SM-B-01Y)  BI-RADS CATEGORY 1: Negative.     Objective:   Physical Exam  Constitutional: She appears well-developed and well-nourished.  Pulmonary/Chest: Right breast exhibits no mass, no skin change and no tenderness. Left breast exhibits no inverted nipple, no mass, no nipple discharge, no skin change and no tenderness.    Lymphadenopathy:    She has no cervical adenopathy.    She has no axillary adenopathy.       Right: No supraclavicular adenopathy present.       Left: No supraclavicular adenopathy present.       Assessment:     Breast cancer, stage 0     Plan:     She is doing well today. She has no clinical evidence of recurrence in her mammogram is up to date. I told her I would happy to be seen her on an annual basis for an exam at separate times from Dr Jana Hakim.  She's going to continue her own self exams monthly.

## 2013-11-14 NOTE — Patient Instructions (Signed)

## 2014-03-07 ENCOUNTER — Ambulatory Visit (INDEPENDENT_AMBULATORY_CARE_PROVIDER_SITE_OTHER): Payer: Federal, State, Local not specified - PPO | Admitting: Family Medicine

## 2014-03-07 ENCOUNTER — Ambulatory Visit (INDEPENDENT_AMBULATORY_CARE_PROVIDER_SITE_OTHER): Payer: Federal, State, Local not specified - PPO

## 2014-03-07 ENCOUNTER — Other Ambulatory Visit: Payer: Self-pay | Admitting: Family Medicine

## 2014-03-07 VITALS — BP 130/74 | HR 84 | Temp 99.9°F | Resp 14 | Ht 66.0 in | Wt 151.6 lb

## 2014-03-07 DIAGNOSIS — R5383 Other fatigue: Secondary | ICD-10-CM

## 2014-03-07 DIAGNOSIS — R5381 Other malaise: Secondary | ICD-10-CM

## 2014-03-07 DIAGNOSIS — R630 Anorexia: Secondary | ICD-10-CM

## 2014-03-07 DIAGNOSIS — M545 Low back pain, unspecified: Secondary | ICD-10-CM

## 2014-03-07 DIAGNOSIS — R634 Abnormal weight loss: Secondary | ICD-10-CM

## 2014-03-07 LAB — BASIC METABOLIC PANEL WITHOUT GFR
BUN: 25 mg/dL — ABNORMAL HIGH (ref 6–23)
Calcium: 9.1 mg/dL (ref 8.4–10.5)
Creat: 1.01 mg/dL (ref 0.50–1.10)
GFR, Est African American: 68 mL/min

## 2014-03-07 LAB — CBC WITH DIFFERENTIAL/PLATELET
Basophils Absolute: 0 10*3/uL (ref 0.0–0.1)
Basophils Relative: 0 % (ref 0–1)
Eosinophils Absolute: 0 10*3/uL (ref 0.0–0.7)
Eosinophils Relative: 0 % (ref 0–5)
HCT: 41.4 % (ref 36.0–46.0)
Hemoglobin: 14.3 g/dL (ref 12.0–15.0)
Lymphocytes Relative: 11 % — ABNORMAL LOW (ref 12–46)
Lymphs Abs: 1.3 10*3/uL (ref 0.7–4.0)
MCH: 26.2 pg (ref 26.0–34.0)
MCHC: 34.5 g/dL (ref 30.0–36.0)
MCV: 75.8 fL — ABNORMAL LOW (ref 78.0–100.0)
Monocytes Absolute: 0.6 10*3/uL (ref 0.1–1.0)
Monocytes Relative: 5 % (ref 3–12)
Neutro Abs: 10.1 10*3/uL — ABNORMAL HIGH (ref 1.7–7.7)
Neutrophils Relative %: 84 % — ABNORMAL HIGH (ref 43–77)
Platelets: 273 10*3/uL (ref 150–400)
RBC: 5.46 MIL/uL — ABNORMAL HIGH (ref 3.87–5.11)
RDW: 14.7 % (ref 11.5–15.5)
WBC: 12 10*3/uL — ABNORMAL HIGH (ref 4.0–10.5)

## 2014-03-07 LAB — POCT UA - MICROSCOPIC ONLY
Casts, Ur, LPF, POC: NEGATIVE
Crystals, Ur, HPF, POC: NEGATIVE
Mucus, UA: POSITIVE
Yeast, UA: NEGATIVE

## 2014-03-07 LAB — BASIC METABOLIC PANEL WITH GFR
CO2: 27 mEq/L (ref 19–32)
Chloride: 97 mEq/L (ref 96–112)
GFR, Est Non African American: 59 mL/min — ABNORMAL LOW
Glucose, Bld: 119 mg/dL — ABNORMAL HIGH (ref 70–99)
Potassium: 3.9 mEq/L (ref 3.5–5.3)
Sodium: 137 mEq/L (ref 135–145)

## 2014-03-07 LAB — POCT URINALYSIS DIPSTICK
Glucose, UA: NEGATIVE
Ketones, UA: 40
Leukocytes, UA: NEGATIVE
Nitrite, UA: NEGATIVE
Protein, UA: 300
Spec Grav, UA: >=1.03
Urobilinogen, UA: 1
pH, UA: 6

## 2014-03-07 LAB — TSH: TSH: 0.284 u[IU]/mL — ABNORMAL LOW (ref 0.350–4.500)

## 2014-03-07 NOTE — Progress Notes (Signed)
Chief Complaint:  Chief Complaint  Patient presents with  . Nausea  . Back Pain    right, lower side of back    HPI: Pamela Summers is a 63 y.o. female who is here for  1 week history of feeling fatigue, she has not wanted to eat, she has left and right sided  back pain. She has herniated disc, she had sharp pain on the right side. She has been gagging but no emesis.  Denies any new meds, travels, foods, sick contacts, abd pain. SHe had some loose bowels this morning, no blood.  No UTI sxs.  No vaginal dc. She has had poor appetite. She is a [place] [employer] worker Has a history of breast cancer s/p right masectomy, radiation. She denies any chemotherapy Cancer is in remission, followed by Dr Donne Hazel ( prior was Dr Margot Chimes before he retired)  and also Location manager She has had a hysterectomy, partial; family h/o uterine /cervical cancer mother MRI of lumbar spine shows disc herniation. She has ahd trace blood in urin in the past in 2014 wuth negative urine cx  Wt Readings from Last 3 Encounters:  03/07/14 151 lb 9.6 oz (68.765 kg)  11/14/13 168 lb (76.204 kg)  08/01/13 164 lb 1.6 oz (74.435 kg)     IMPRESSION:  L4-5 bulge with shallow right lateral protrusion which touches but  does not compress the exiting right L4 nerve root. Facet joint  degenerative changes and ligamentum flavum hypertrophy greater on  the left. No significant spinal stenosis.  L3-4 mild bulge slightly greater to the right. No compression of  the exiting L3 nerve roots.   From EPIC records: History of right upper outer quadrant breast cancer status post lumpectomy and axillary lymph node dissection in 1989, when the patient was 63 years old. Did not receive adjuvant chemotherapy or antiestrogens.  She underwent adjuvant radiation under Dr Arloa Koh.  On 10/10/2012 she underwent bilateral breast MRI which showed a 2.4 cm area of clumped linear enhancement surrounding the recent biopsy clip there were no other  suspicious areas in the right breast, and no findings of concern in the left breast or regional lymph node area.  She had a right simple mastectomy 04/14/2013. 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   Past Medical History  Diagnosis Date  . Arthritis   . Heart murmur   . Cancer   . Hearing loss   . Breast cancer 1989, 2014  . Hypertension   . Complication of anesthesia     pt states"difficult to wake up"  . PONV (postoperative nausea and vomiting)     "scop patch placed and does not work"   Past Surgical History  Procedure Laterality Date  . Abdominal hysterectomy      partical  . Breast surgery  1990    lumpectomy - right  . Ganglion cyst excision  2002 - approximate  . Stapedes surgery Right 2011  . Total mastectomy Right 04/14/2013    Procedure: TOTAL MASTECTOMY;  Surgeon: Haywood Lasso, MD;  Location: [facility];  Service: General;  Laterality: Right;   History   Social History  . Marital Status: Single    Spouse Name: N/A    Number of Children: N/A  . Years of Education: N/A   Social History Main Topics  . Smoking status: Never Smoker   . Smokeless tobacco: Never Used  .  Alcohol Use: No  . Drug Use: No  . Sexual Activity: No   Other Topics Concern  . None   Social History Narrative  . None   Family History  Problem Relation Age of Onset  . Cancer Mother     uterine or ovarian cancer  . Heart disease Father   . Cancer Sister     maternal half sister with uterine or cervical cancer; died in her 42s  . Cancer Maternal Uncle     unknown cancer  . Cancer Cousin     3 maternal cousins with unknown cancers   No Known Allergies Prior to Admission medications   Medication Sig Start Date End Date Taking? Authorizing Provider  Calcium Carbonate-Vitamin D (CALCIUM-VITAMIN D) 500-200 MG-UNIT per tablet Take 1 tablet by mouth daily.   Yes Historical Provider, MD  hydrochlorothiazide  (HYDRODIURIL) 12.5 MG tablet Take 12.5 mg by mouth daily.   Yes Historical Provider, MD  HYDROcodone-acetaminophen (NORCO/VICODIN) 5-325 MG per tablet  05/11/13  Yes Historical Provider, MD  metoprolol tartrate (LOPRESSOR) 25 MG tablet TAKE 1 TABLET BY MOUTH TWICE DAILY. NEEDS OFFICE VISIT FOR ADDITIONAL REFILLS. 08/02/13  Yes Ryan M Dunn, PA-C  UNABLE TO FIND Rx: [phone]- Post Mastectomy Garment (Quantity: 2) Dx: 174.9- Right Mastectomy 04/28/13  Yes Haywood Lasso, MD     ROS: The patient denies fevers, chills, night sweats,  chest pain, palpitations, wheezing, dyspnea on exertion, nausea, vomiting, abdominal pain, dysuria, hematuria, melena, numbness,  or tingling.   All other systems have been reviewed and were otherwise negative with the exception of those mentioned in the HPI and as above.    PHYSICAL EXAM: Filed Vitals:   03/07/14 0922  BP: 130/74  Pulse: 84  Temp: 99.9 F (37.7 C)  Resp: 14   Filed Vitals:   03/07/14 0922  Height: 5\' 6"  (1.676 m)  Weight: 151 lb 9.6 oz (68.765 kg)   Body mass index is 24.48 kg/(m^2).  General: Alert, no acute distress HEENT:  Normocephalic, atraumatic, oropharynx patent. EOMI, PERRLA Cardiovascular:  Regular rate and rhythm, no rubs murmurs or gallops.  No Carotid bruits, radial pulse intact. No pedal edema.  Respiratory: Clear to auscultation bilaterally.  No wheezes, rales, or rhonchi.  No cyanosis, no use of accessory musculature GI: No organomegaly, abdomen is soft and non-tender, positive bowel sounds.  No masses. Skin: No rashes. Neurologic: Facial musculature symmetric. Psychiatric: Patient is appropriate throughout our interaction. Lymphatic: No cervical lymphadenopathy Musculoskeletal: Gait intact.   LABS: Results for orders placed in visit on 03/07/14  POCT URINALYSIS DIPSTICK      Result Value Ref Range   Color, UA brown     Clarity, UA cloudy     Glucose, UA neg     Bilirubin, UA mod     Ketones, UA 40     Spec Grav,  UA >=1.030     Blood, UA mod     pH, UA 6.0     Protein, UA 300     Urobilinogen, UA 1.0     Nitrite, UA neg     Leukocytes, UA Negative    POCT UA - MICROSCOPIC ONLY      Result Value Ref Range   WBC, Ur, HPF, POC tntc     RBC, urine, microscopic 8-13     Bacteria, U Microscopic 4+     Mucus, UA pos     Epithelial cells, urine per micros 0-4     Crystals, Ur, HPF, POC  neg     Casts, Ur, LPF, POC neg     Yeast, UA neg       EKG/XRAY:   Primary read interpreted by Dr. Marin Comment at [facility]. Neg fx/dislocation No e/o mets   ASSESSMENT/PLAN: Encounter Diagnoses  Name Primary?  . Left-sided low back pain without sciatica Yes  . Unintended weight loss   . Other malaise and fatigue   . Loss of appetite    Will await for labs No meds at this time ? Etiology, will check TSH  Her urine is abnormal but no e/o leuk or nitrates c/w infection Push fuids Will  Call her and ask her to f/u once get labs.   Gross sideeffects, risk and benefits, and alternatives of medications d/w patient. Patient is aware that all medications have potential sideeffects and we are unable to predict every sideeffect or drug-drug interaction that may occur.  LE, Dola, DO 03/07/2014 11:42 AM

## 2014-03-08 ENCOUNTER — Telehealth: Payer: Self-pay | Admitting: Family Medicine

## 2014-03-08 ENCOUNTER — Emergency Department (HOSPITAL_COMMUNITY)
Admission: EM | Admit: 2014-03-08 | Discharge: 2014-03-09 | Disposition: A | Discharge status: Home / Self Care | Payer: Federal, State, Local not specified - PPO | Attending: Emergency Medicine | Admitting: Emergency Medicine

## 2014-03-08 ENCOUNTER — Encounter (HOSPITAL_COMMUNITY): Payer: Self-pay | Admitting: Emergency Medicine

## 2014-03-08 DIAGNOSIS — Z792 Long term (current) use of antibiotics: Secondary | ICD-10-CM | POA: Insufficient documentation

## 2014-03-08 DIAGNOSIS — M129 Arthropathy, unspecified: Secondary | ICD-10-CM | POA: Insufficient documentation

## 2014-03-08 DIAGNOSIS — N390 Urinary tract infection, site not specified: Secondary | ICD-10-CM | POA: Insufficient documentation

## 2014-03-08 DIAGNOSIS — I10 Essential (primary) hypertension: Secondary | ICD-10-CM | POA: Insufficient documentation

## 2014-03-08 DIAGNOSIS — R5383 Other fatigue: Principal | ICD-10-CM

## 2014-03-08 DIAGNOSIS — Z853 Personal history of malignant neoplasm of breast: Secondary | ICD-10-CM | POA: Insufficient documentation

## 2014-03-08 DIAGNOSIS — R5381 Other malaise: Secondary | ICD-10-CM | POA: Insufficient documentation

## 2014-03-08 DIAGNOSIS — R011 Cardiac murmur, unspecified: Secondary | ICD-10-CM | POA: Insufficient documentation

## 2014-03-08 DIAGNOSIS — H9190 Unspecified hearing loss, unspecified ear: Secondary | ICD-10-CM | POA: Insufficient documentation

## 2014-03-08 DIAGNOSIS — R5382 Chronic fatigue, unspecified: Secondary | ICD-10-CM

## 2014-03-08 DIAGNOSIS — Z79899 Other long term (current) drug therapy: Secondary | ICD-10-CM | POA: Insufficient documentation

## 2014-03-08 LAB — CBC
HCT: 41 % (ref 36.0–46.0)
HEMOGLOBIN: 13.6 g/dL (ref 12.0–15.0)
MCH: 25.7 pg — AB (ref 26.0–34.0)
MCHC: 33.2 g/dL (ref 30.0–36.0)
MCV: 77.5 fL — ABNORMAL LOW (ref 78.0–100.0)
PLATELETS: 267 10*3/uL (ref 150–400)
RBC: 5.29 MIL/uL — ABNORMAL HIGH (ref 3.87–5.11)
RDW: 14.3 % (ref 11.5–15.5)
WBC: 10.8 10*3/uL — AB (ref 4.0–10.5)

## 2014-03-08 LAB — BASIC METABOLIC PANEL
Anion gap: 16 — ABNORMAL HIGH (ref 5–15)
BUN: 30 mg/dL — ABNORMAL HIGH (ref 6–23)
CO2: 24 mEq/L (ref 19–32)
CREATININE: 0.75 mg/dL (ref 0.50–1.10)
Calcium: 9.7 mg/dL (ref 8.4–10.5)
Chloride: 97 mEq/L (ref 96–112)
GFR calc non Af Amer: 88 mL/min — ABNORMAL LOW (ref >90)
Glucose, Bld: 112 mg/dL — ABNORMAL HIGH (ref 70–99)
POTASSIUM: 3.9 meq/L (ref 3.7–5.3)
SODIUM: 137 meq/L (ref 137–147)

## 2014-03-08 MED ORDER — DEXTROSE 5 % IV SOLN
1.0000 g | Freq: Once | INTRAVENOUS | Status: AC
  Administered 2014-03-09: 1 g via INTRAVENOUS
  Filled 2014-03-08: qty 10

## 2014-03-08 NOTE — ED Notes (Signed)
Pt states she feels very weak and dehydrated  [facility]  Pt states she has no appetite, has dry chapped lips  Pt states she feels like she needs to gag when food or anything is in her mouth  Pt states the only thing they told her yesterday was to increase her fluid intake and rest  Pt states she feels worse today than yesterday  Pt denies vomiting, diarrhea, or pain

## 2014-03-08 NOTE — ED Provider Notes (Signed)
CSN: [id number]     Arrival date & time 03/08/14  2031 History   First MD Initiated Contact with Patient 03/08/14 2323     Chief Complaint  Patient presents with  . Dehydration  . Weakness     (Consider location/radiation/quality/duration/timing/severity/associated sxs/prior Treatment) HPI Comments: 63 y/o female with hx of weakness and fatigue X 1 week - states she has dehdyration - mouth dry, trouble swallowing but no pain anywhere - no diffulty urinating, no diarrhea and no cp, sob, abd pain or any other c/o.  She has had very little energy - nothing makes better.  She was seen at [facility] yesterday, mild elevation in WBC and normal BMP other than elevated BUN and UA with signs of UTI.  No meds Rx yesterday.  TSH was slightly low.  Patient is a 63 y.o. female presenting with weakness. The history is provided by the patient, medical records and a relative.  Weakness    Past Medical History  Diagnosis Date  . Arthritis   . Heart murmur   . Cancer   . Hearing loss   . Breast cancer 1989, 2014  . Hypertension   . Complication of anesthesia     pt states"difficult to wake up"  . PONV (postoperative nausea and vomiting)     "scop patch placed and does not work"   Past Surgical History  Procedure Laterality Date  . Abdominal hysterectomy      partical  . Breast surgery  1990    lumpectomy - right  . Ganglion cyst excision  2002 - approximate  . Stapedes surgery Right 2011  . Total mastectomy Right 04/14/2013    Procedure: TOTAL MASTECTOMY;  Surgeon: Haywood Lasso, MD;  Location: [facility] OR;  Service: General;  Laterality: Right;   Family History  Problem Relation Age of Onset  . Cancer Mother     uterine or ovarian cancer  . Heart disease Father   . Cancer Sister     maternal half sister with uterine or cervical cancer; died in her 51s  . Cancer Maternal Uncle     unknown cancer  . Cancer Cousin     3 maternal cousins with unknown cancers   History  Substance Use Topics   . Smoking status: Never Smoker   . Smokeless tobacco: Never Used  . Alcohol Use: No   OB History   Grav Para Term Preterm Abortions TAB SAB Ect Mult Living                 Review of Systems  Neurological: Positive for weakness.  All other systems reviewed and are negative.     Allergies  Review of patient's allergies indicates no known allergies.  Home Medications   Prior to Admission medications   Medication Sig Start Date End Date Taking? Authorizing Provider  cholecalciferol (VITAMIN D) 1000 UNITS tablet Take 1,000 Units by mouth daily.   Yes Historical Provider, MD  metoprolol tartrate (LOPRESSOR) 25 MG tablet Take 25 mg by mouth once a week.   Yes Historical Provider, MD  cephALEXin (KEFLEX) 500 MG capsule Take 1 capsule (500 mg total) by mouth 4 (four) times daily. 03/09/14   Johnna Acosta, MD   BP 123/79  Pulse 72  Temp(Src) 99.1 F (37.3 C) (Oral)  Resp 18  SpO2 95% Physical Exam  Nursing note and vitals reviewed. Constitutional: She appears well-developed and well-nourished. No distress.  HENT:  Head: Normocephalic and atraumatic.  Mouth/Throat: Oropharynx is clear and moist.  No oropharyngeal exudate.  MM slightly dry  Eyes: Conjunctivae and EOM are normal. Pupils are equal, round, and reactive to light. Right eye exhibits no discharge. Left eye exhibits no discharge. No scleral icterus.  Neck: Normal range of motion. Neck supple. No JVD present. No thyromegaly present.  Cardiovascular: Normal rate, regular rhythm, normal heart sounds and intact distal pulses.  Exam reveals no gallop and no friction rub.   No murmur heard. Pulmonary/Chest: Effort normal and breath sounds normal. No respiratory distress. She has no wheezes. She has no rales.  Abdominal: Soft. Bowel sounds are normal. She exhibits no distension and no mass. There is no tenderness.  Musculoskeletal: Normal range of motion. She exhibits no edema and no tenderness.  Lymphadenopathy:    She has no  cervical adenopathy.  Neurological: She is alert. Coordination normal.  Neurologic exam:  Speech clear, pupils equal round reactive to light, extraocular movements intact  Normal peripheral visual fields Cranial nerves III through XII normal including no facial droop Follows commands, moves all extremities x4, normal strength to bilateral upper and lower extremities at all major muscle groups including grip Sensation normal to light touch and pinprick Coordination intact, no limb ataxia, finger-nose-finger normal Rapid alternating movements normal No pronator drift Gait normal   Skin: Skin is warm and dry. No rash noted. No erythema.  Psychiatric: She has a normal mood and affect. Her behavior is normal.    ED Course  Procedures (including critical care time) Labs Review Labs Reviewed  CBC - Abnormal; Notable for the following:    WBC 10.8 (*)    RBC 5.29 (*)    MCV 77.5 (*)    MCH 25.7 (*)    All other components within normal limits  BASIC METABOLIC PANEL - Abnormal; Notable for the following:    Glucose, Bld 112 (*)    BUN 30 (*)    GFR calc non Af Amer 88 (*)    Anion gap 16 (*)    All other components within normal limits  URINALYSIS, ROUTINE W REFLEX MICROSCOPIC - Abnormal; Notable for the following:    Color, Urine AMBER (*)    APPearance CLOUDY (*)    Specific Gravity, Urine 1.031 (*)    Hgb urine dipstick MODERATE (*)    Ketones, ur 40 (*)    Protein, ur >300 (*)    All other components within normal limits  URINE MICROSCOPIC-ADD ON - Abnormal; Notable for the following:    Squamous Epithelial / LPF FEW (*)    Bacteria, UA MANY (*)    Casts HYALINE CASTS (*)    All other components within normal limits  URINE CULTURE    Imaging Review Dg Lumbar Spine 2-3 Views  03/07/2014   CLINICAL DATA:  Back pain  EXAM: LUMBAR SPINE - 2-3 VIEW  COMPARISON:  None.  FINDINGS: There is no evidence of lumbar spine fracture. Alignment is normal. Intervertebral disc spaces  are maintained.  IMPRESSION: Negative.   Electronically Signed   By: Kathreen Devoid   On: 03/07/2014 13:01     MDM   Final diagnoses:  Chronic fatigue  UTI (lower urinary tract infection)    Normal appearing, VS normal - UA suggests UTI from yesterday - recheck, hydrated, anticipate d/c - no focal neuro defectis - weaknes is as "fatigue".  UA looks moderate - cx ordered, fluids given and Rocephin, stable for d/c, pt informed of results.  Meds given in ED:  Medications  cefTRIAXone (ROCEPHIN) 1 g  in dextrose 5 % 50 mL IVPB (0 g Intravenous Stopped 03/09/14 0032)    New Prescriptions   CEPHALEXIN (KEFLEX) 500 MG CAPSULE    Take 1 capsule (500 mg total) by mouth 4 (four) times daily.      Johnna Acosta, MD 03/09/14 [phone]

## 2014-03-08 NOTE — Telephone Encounter (Signed)
Lm to call me back about labs.

## 2014-03-09 ENCOUNTER — Encounter: Payer: Self-pay | Admitting: Family Medicine

## 2014-03-09 LAB — URINALYSIS, ROUTINE W REFLEX MICROSCOPIC
BILIRUBIN URINE: NEGATIVE
Glucose, UA: NEGATIVE mg/dL
Ketones, ur: 40 mg/dL — AB
LEUKOCYTES UA: NEGATIVE
Nitrite: NEGATIVE
Protein, ur: >300 mg/dL — AB
Specific Gravity, Urine: 1.031 — ABNORMAL HIGH (ref 1.005–1.030)
UROBILINOGEN UA: 0.2 mg/dL (ref 0.0–1.0)
pH: 6 (ref 5.0–8.0)

## 2014-03-09 LAB — URINE MICROSCOPIC-ADD ON

## 2014-03-09 LAB — T4, FREE: Free T4: 0.89 ng/dL (ref 0.80–1.80)

## 2014-03-09 LAB — T3, FREE: T3, Free: 1.6 pg/mL — ABNORMAL LOW (ref 2.3–4.2)

## 2014-03-09 MED ORDER — CEPHALEXIN 500 MG PO CAPS: 500.0000 mg | ORAL_CAPSULE | Freq: Four times a day (QID) | ORAL | Status: DC

## 2014-03-09 NOTE — Telephone Encounter (Signed)
LM about labs, will add free t4 and t3 and then also refer to endocrinology. SHe has ahd unintentional weightloss. She has had poor appetite. She went to the ED yesterday and was dx with UTI and given rocephin and keflex. Would like her to return to get rechecked if she is not better on meds. Push fluids. She has had trace blood with neg urine cx in the past.

## 2014-03-10 ENCOUNTER — Other Ambulatory Visit: Payer: Self-pay | Admitting: Family Medicine

## 2014-03-10 ENCOUNTER — Telehealth: Payer: Self-pay | Admitting: Family Medicine

## 2014-03-10 ENCOUNTER — Encounter: Payer: Self-pay | Admitting: Family Medicine

## 2014-03-10 DIAGNOSIS — R634 Abnormal weight loss: Secondary | ICD-10-CM

## 2014-03-10 DIAGNOSIS — R7989 Other specified abnormal findings of blood chemistry: Secondary | ICD-10-CM

## 2014-03-10 LAB — URINE CULTURE
Colony Count: 15000
SPECIAL REQUESTS: NORMAL

## 2014-03-10 NOTE — Telephone Encounter (Signed)
Attempted to call about abnormal thyroid tests and also normal urine culture. Will send letter.

## 2014-03-13 ENCOUNTER — Telehealth: Payer: Self-pay

## 2014-03-13 ENCOUNTER — Ambulatory Visit (INDEPENDENT_AMBULATORY_CARE_PROVIDER_SITE_OTHER): Payer: Federal, State, Local not specified - PPO | Admitting: Family Medicine

## 2014-03-13 VITALS — BP 122/78 | HR 77 | Temp 98.1°F | Resp 16 | Ht 66.0 in | Wt 154.2 lb

## 2014-03-13 DIAGNOSIS — R5381 Other malaise: Secondary | ICD-10-CM

## 2014-03-13 DIAGNOSIS — R82998 Other abnormal findings in urine: Secondary | ICD-10-CM

## 2014-03-13 DIAGNOSIS — R8290 Unspecified abnormal findings in urine: Secondary | ICD-10-CM

## 2014-03-13 DIAGNOSIS — E0590 Thyrotoxicosis, unspecified without thyrotoxic crisis or storm: Secondary | ICD-10-CM

## 2014-03-13 DIAGNOSIS — R634 Abnormal weight loss: Secondary | ICD-10-CM

## 2014-03-13 DIAGNOSIS — R5383 Other fatigue: Secondary | ICD-10-CM

## 2014-03-13 LAB — POCT URINALYSIS DIPSTICK
Glucose, UA: NEGATIVE
Leukocytes, UA: NEGATIVE
Nitrite, UA: NEGATIVE
Protein, UA: 100
Spec Grav, UA: 1.025
Urobilinogen, UA: 0.2
pH, UA: 6

## 2014-03-13 LAB — POCT UA - MICROSCOPIC ONLY
Casts, Ur, LPF, POC: NEGATIVE
Crystals, Ur, HPF, POC: NEGATIVE
Yeast, UA: NEGATIVE

## 2014-03-13 NOTE — Progress Notes (Signed)
Chief Complaint:  Chief Complaint  Patient presents with  . Follow-up    for ED visit and fatigue    HPI: Pamela Summers is a 63 y.o. female who is here for  Recheck of her abnormal labs and feeling of generalized fatigue She had seen me on 03/07/14  Urine showed dehydration with ketones, proteinuria, and mod blood;  CBC was normal She was still feeling  terrible so went to he ER the following day.  She was seen in ED and was given meds for UTI although  her urine did not grow out anything, she also got fluids IVF x 1 L and felt better. She also got 1 gram of Rocephin and was dc with keflex.  She is still on keflex. Her appetite is a little better but energy is still low  TSH was low -she has a hsitory of hyperthyroid which was being followed by Dr Chalmers Cater long ago She has not seen her in a long time for this She also has a family h.o uterine or cervical cancer, her pap [facility] was 2 weeks ago with [facility].  She does not know  if she has had chronically abnormal urines in the past   [facility]-Dr Meyser, she had a pap and she does not have a history of recent abdnormal pap Mother and sister  passed away from uterine or cervical cancer-- she was 9 when this happenbed.   Past Medical History  Diagnosis Date  . Arthritis   . Heart murmur   . Cancer   . Hearing loss   . Breast cancer 1989, 2014  . Hypertension   . Complication of anesthesia     pt states"difficult to wake up"  . PONV (postoperative nausea and vomiting)     "scop patch placed and does not work"   Past Surgical History  Procedure Laterality Date  . Abdominal hysterectomy      partical  . Breast surgery  1990    lumpectomy - right  . Ganglion cyst excision  2002 - approximate  . Stapedes surgery Right 2011  . Total mastectomy Right 04/14/2013    Procedure: TOTAL MASTECTOMY;  Surgeon: Haywood Lasso, MD;  Location: [place];  Service: General;  Laterality: Right;   History   Social  History  . Marital Status: Single    Spouse Name: N/A    Number of Children: N/A  . Years of Education: N/A   Social History Main Topics  . Smoking status: Never Smoker   . Smokeless tobacco: Never Used  . Alcohol Use: No  . Drug Use: No  . Sexual Activity: No   Other Topics Concern  . None   Social History Narrative  . None   Family History  Problem Relation Age of Onset  . Cancer Mother     uterine or ovarian cancer  . Heart disease Father   . Cancer Sister     maternal half sister with uterine or cervical cancer; died in her 16s  . Cancer Maternal Uncle     unknown cancer  . Cancer Cousin     3 maternal cousins with unknown cancers   No Known Allergies Prior to Admission medications   Medication Sig Start Date End Date Taking? Authorizing Provider  cephALEXin (KEFLEX) 500 MG capsule Take 1 capsule (500 mg total) by mouth 4 (four) times daily. 03/09/14  Yes Johnna Acosta, MD  cholecalciferol (VITAMIN D) 1000 UNITS tablet  Take 1,000 Units by mouth daily.   Yes Historical Provider, MD  metoprolol tartrate (LOPRESSOR) 25 MG tablet Take 25 mg by mouth once a week.   Yes Historical Provider, MD     ROS: The patient denies fevers, chills, night sweats,  chest pain, palpitations, wheezing, dyspnea on exertion, nausea, vomiting, abdominal pain, dysuria, hematuria, melena  All other systems have been reviewed and were otherwise negative with the exception of those mentioned in the HPI and as above.    PHYSICAL EXAM: Filed Vitals:   03/13/14 1340  BP: 122/78  Pulse: 77  Temp: 98.1 F (36.7 C)  Resp: 16   Filed Vitals:   03/13/14 1340  Height: 5\' 6"  (1.676 m)  Weight: 154 lb 3.2 oz (69.945 kg)   Body mass index is 24.9 kg/(m^2).  General: Alert, no acute distress HEENT:  Normocephalic, atraumatic, oropharynx patent. EOMI, PERRLA Cardiovascular:  Regular rate and rhythm, no rubs murmurs or gallops.  No Carotid bruits, radial pulse intact. No pedal edema.    Respiratory: Clear to auscultation bilaterally.  No wheezes, rales, or rhonchi.  No cyanosis, no use of accessory musculature GI: No organomegaly, abdomen is soft and non-tender, positive bowel sounds.  No masses. Skin: No rashes. Neurologic: Facial musculature symmetric. Psychiatric: Patient is appropriate throughout our interaction. Lymphatic: No cervical lymphadenopathy Musculoskeletal: Gait intact.   LABS: Results for orders placed in visit on 03/13/14  COMPLETE METABOLIC PANEL WITH GFR      Result Value Ref Range   Sodium 133 (*) 135 - 145 mEq/L   Potassium 3.9  3.5 - 5.3 mEq/L   Chloride 95 (*) 96 - 112 mEq/L   CO2 25  19 - 32 mEq/L   Glucose, Bld 106 (*) 70 - 99 mg/dL   BUN 14  6 - 23 mg/dL   Creat 0.60  0.50 - 1.10 mg/dL   Total Bilirubin 0.5  0.2 - 1.2 mg/dL   Alkaline Phosphatase 81  39 - 117 U/L   AST 33  0 - 37 U/L   ALT 35  0 - 35 U/L   Total Protein 8.0  6.0 - 8.3 g/dL   Albumin 3.7  3.5 - 5.2 g/dL   Calcium 9.0  8.4 - 10.5 mg/dL   GFR, Est African American >89     GFR, Est Non African American >89    POCT UA - MICROSCOPIC ONLY      Result Value Ref Range   WBC, Ur, HPF, POC 3-4     RBC, urine, microscopic 1-4     Bacteria, U Microscopic 1+     Mucus, UA trace     Epithelial cells, urine per micros 1-2     Crystals, Ur, HPF, POC neg     Casts, Ur, LPF, POC neg     Yeast, UA neg    POCT URINALYSIS DIPSTICK      Result Value Ref Range   Color, UA amber     Clarity, UA cloudy     Glucose, UA neg     Bilirubin, UA smakk     Ketones, UA trace     Spec Grav, UA 1.025     Blood, UA small     pH, UA 6.0     Protein, UA 100     Urobilinogen, UA 0.2     Nitrite, UA neg     Leukocytes, UA Negative       EKG/XRAY:   Primary read interpreted by Dr. Marin Comment at  [facility].   ASSESSMENT/PLAN: Encounter Diagnoses  Name Primary?  . Abnormal urine Yes  . Other malaise and fatigue   . Hyperthyroidism   . Weight loss    Rechecked urine-improved Check old records  for history of any proteinuria/hematuria in the past Refer to Dr Chalmers Cater She is going to cont with keflex  She will get CMP rechecked.   Gross sideeffects, risk and benefits, and alternatives of medications d/w patient. Patient is aware that all medications have potential sideeffects and we are unable to predict every sideeffect or drug-drug interaction that may occur.  Leotis Pain, DO 03/14/2014 9:55 AM   Spoke with patient about labs, will get old records. She was given precautions  to go to ER prn.

## 2014-03-14 LAB — COMPLETE METABOLIC PANEL WITHOUT GFR
ALT: 35 U/L (ref 0–35)
BUN: 14 mg/dL (ref 6–23)
Chloride: 95 meq/L — ABNORMAL LOW (ref 96–112)
Creat: 0.6 mg/dL (ref 0.50–1.10)
GFR, Est African American: >89 mL/min
Glucose, Bld: 106 mg/dL — ABNORMAL HIGH (ref 70–99)

## 2014-03-14 LAB — COMPLETE METABOLIC PANEL WITH GFR
AST: 33 U/L (ref 0–37)
Albumin: 3.7 g/dL (ref 3.5–5.2)
Alkaline Phosphatase: 81 U/L (ref 39–117)
CO2: 25 mEq/L (ref 19–32)
Calcium: 9 mg/dL (ref 8.4–10.5)
GFR, Est Non African American: >89 mL/min
Potassium: 3.9 mEq/L (ref 3.5–5.3)
Sodium: 133 mEq/L — ABNORMAL LOW (ref 135–145)
Total Bilirubin: 0.5 mg/dL (ref 0.2–1.2)
Total Protein: 8 g/dL (ref 6.0–8.3)

## 2014-03-15 LAB — URINE CULTURE
Colony Count: NO GROWTH
Organism ID, Bacteria: NO GROWTH

## 2014-03-16 ENCOUNTER — Telehealth: Payer: Self-pay

## 2014-03-16 DIAGNOSIS — R8290 Unspecified abnormal findings in urine: Secondary | ICD-10-CM

## 2014-03-16 DIAGNOSIS — R809 Proteinuria, unspecified: Secondary | ICD-10-CM

## 2014-03-16 DIAGNOSIS — R319 Hematuria, unspecified: Secondary | ICD-10-CM

## 2014-03-16 NOTE — Telephone Encounter (Signed)
Pt dropped off FMLA ppw 7/22, put in Dr. Gus Puma box 7/23

## 2014-03-16 NOTE — Telephone Encounter (Signed)
Spoke with patient, she has an appt with [facility] on August 6 at 8:15.  She will also drop off urine specimen for recheck on Friday 31.  She received her recent pap test and it was normal.

## 2014-03-24 ENCOUNTER — Other Ambulatory Visit (INDEPENDENT_AMBULATORY_CARE_PROVIDER_SITE_OTHER): Payer: Federal, State, Local not specified - PPO | Admitting: *Deleted

## 2014-03-24 DIAGNOSIS — R82998 Other abnormal findings in urine: Secondary | ICD-10-CM

## 2014-03-24 DIAGNOSIS — N390 Urinary tract infection, site not specified: Secondary | ICD-10-CM

## 2014-03-24 DIAGNOSIS — R809 Proteinuria, unspecified: Secondary | ICD-10-CM

## 2014-03-24 DIAGNOSIS — R8290 Unspecified abnormal findings in urine: Secondary | ICD-10-CM

## 2014-03-24 DIAGNOSIS — R319 Hematuria, unspecified: Secondary | ICD-10-CM

## 2014-03-24 LAB — POCT URINALYSIS DIPSTICK
Bilirubin, UA: NEGATIVE
Blood, UA: NEGATIVE
Glucose, UA: NEGATIVE
Nitrite, UA: NEGATIVE
Protein, UA: 30
Spec Grav, UA: 1.015
Urobilinogen, UA: 0.2
pH, UA: 7

## 2014-03-24 LAB — POCT UA - MICROSCOPIC ONLY
Casts, Ur, LPF, POC: NEGATIVE
Crystals, Ur, HPF, POC: NEGATIVE
Mucus, UA: NEGATIVE
RBC, urine, microscopic: NEGATIVE
Yeast, UA: NEGATIVE

## 2014-03-24 NOTE — Progress Notes (Signed)
Pt here for labs only. 

## 2014-03-24 NOTE — Telephone Encounter (Signed)
Spoke to patient about labs, she is going to see Dr Letta Median for endo , not Dr Chalmers Cater.

## 2014-03-25 LAB — URINE CULTURE: Colony Count: 25000

## 2014-03-30 ENCOUNTER — Ambulatory Visit (INDEPENDENT_AMBULATORY_CARE_PROVIDER_SITE_OTHER): Payer: Federal, State, Local not specified - PPO | Admitting: Internal Medicine

## 2014-03-30 ENCOUNTER — Encounter: Payer: Self-pay | Admitting: Internal Medicine

## 2014-03-30 VITALS — BP 138/98 | HR 83 | Temp 98.3°F | Resp 12 | Ht 66.0 in | Wt 153.6 lb

## 2014-03-30 DIAGNOSIS — R946 Abnormal results of thyroid function studies: Secondary | ICD-10-CM

## 2014-03-30 DIAGNOSIS — R7989 Other specified abnormal findings of blood chemistry: Secondary | ICD-10-CM

## 2014-03-30 LAB — T4, FREE: Free T4: 0.75 ng/dL (ref 0.60–1.60)

## 2014-03-30 LAB — T3, FREE: T3, Free: 3 pg/mL (ref 2.3–4.2)

## 2014-03-30 LAB — TSH: TSH: 0.13 u[IU]/mL — ABNORMAL LOW (ref 0.35–4.50)

## 2014-03-30 NOTE — Patient Instructions (Signed)
Please stop at the lab. Depending on the results, we may need to check a thyroid uptake and scan. Please come back for a follow-up appointment in 3 months.  Hyperthyroidism The thyroid is a large gland located in the lower front part of your neck. The thyroid helps control metabolism. Metabolism is how your body uses food. It controls metabolism with the hormone thyroxine. When the thyroid is overactive, it produces too much hormone. When this happens, these following problems may occur:   Nervousness  Heat intolerance  Weight loss (in spite of increase food intake)  Diarrhea  Change in hair or skin texture  Palpitations (heart skipping or having extra beats)  Tachycardia (rapid heart rate)  Loss of menstruation (amenorrhea)  Shaking of the hands CAUSES  Grave's Disease (the immune system attacks the thyroid gland). This is the most common cause.  Inflammation of the thyroid gland.  Tumor (usually benign) in the thyroid gland or elsewhere.  Excessive use of thyroid medications (both prescription and 'natural').  Excessive ingestion of Iodine. DIAGNOSIS  To prove hyperthyroidism, your caregiver may do blood tests and ultrasound tests. Sometimes the signs are hidden. It may be necessary for your caregiver to watch this illness with blood tests, either before or after diagnosis and treatment. TREATMENT Short-term treatment There are several treatments to control symptoms. Drugs called beta blockers may give some relief. Drugs that decrease hormone production will provide temporary relief in many people. These measures will usually not give permanent relief. Definitive therapy There are treatments available which can be discussed between you and your caregiver which will permanently treat the problem. These treatments range from surgery (removal of the thyroid), to the use of radioactive iodine (destroys the thyroid by radiation), to the use of antithyroid drugs (interfere with  hormone synthesis). The first two treatments are permanent and usually successful. They most often require hormone replacement therapy for life. This is because it is impossible to remove or destroy the exact amount of thyroid required to make a person euthyroid (normal). HOME CARE INSTRUCTIONS  See your caregiver if the problems you are being treated for get worse. Examples of this would be the problems listed above. SEEK MEDICAL CARE IF: Your general condition worsens. MAKE SURE YOU:   Understand these instructions.  Will watch your condition.  Will get help right away if you are not doing well or get worse. Document Released: 08/11/2005 Document Revised: 11/03/2011 Document Reviewed: 12/23/2006 [facility] Patient Information 2015 [place], [place]. This information is not intended to replace advice given to you by your health care provider. Make sure you discuss any questions you have with your health care provider.

## 2014-03-30 NOTE — Progress Notes (Signed)
Patient ID: Pamela Summers, female   DOB: August 16, 1951, 63 y.o.   MRN: [id number]   HPI  Pamela Summers is a 63 y.o.-year-old female, referred by her PCP, Dr. Rikki Spearing, in consultation for low TSH. PCP: Dr. Shirline Frees.  Pt remembers that 9-10 years ago, she was dx'ed by ObGyn: "overactive thyroid" >> followed by Dr Chalmers Cater with regular TFTs >> no meds needed. She was asymptomatic other than increased appetite and weight loss >> stabilized since then. She did not see Dr Chalmers Cater in "years".   She had severe fatigue for 3 weeks at the end of June. She went to see Dr Marin Comment >> TFTs returned abnormal. She felt worse afterwards >> went to the ED >> dehydrated >> hydrated and given IV Abx and d/c'ed on Cephalexin for a UTI. She now feels much better.  I reviewed pt's thyroid tests: Component     Latest Ref Rng 03/07/2014  TSH     0.350 - 4.500 uIU/mL 0.284 (L)  Free T4     0.80 - 1.80 ng/dL 0.89  T3, Free     2.3 - 4.2 pg/mL 1.6 (L)  Of note, she was on Metoprolol, not when the above tests were drawn.  Pt denies feeling nodules in neck, hoarseness, dysphagia/odynophagia, SOB with lying down; she c/o: - + weight loss (as she could not eat well) - no excessive sweating/heat intolerance - + tremors - when sick (+ thirst) - resolved - no anxiety - no palpitations - ++ fatigue - no hyperdefecation  Pt does not have a FH of thyroid ds. No FH of thyroid cancer. No h/o radiation tx to head or neck. She had RxTx for BrCa in 1990.  No seaweed or kelp, no recent contrast studies. No steroid use. No herbal supplements.   ROS: Constitutional:+ both weight gain/loss, no fatigue, no subjective hyperthermia/hypothermia Eyes: no blurry vision, no xerophthalmia ENT: no sore throat, no nodules palpated in throat, no dysphagia/odynophagia, no hoarseness Cardiovascular: no CP/SOB/palpitations/leg swelling Respiratory: no cough/SOB Gastrointestinal: no N/V/D/C Musculoskeletal: no muscle/joint aches Skin: no  rashes Neurological: + tremors/no numbness/tingling/dizziness, + HAs Psychiatric: no depression/anxiety  Past Medical History  Diagnosis Date  . Arthritis   . Heart murmur   . Cancer   . Hearing loss   . Breast cancer 1989, 2014  . Hypertension   . Complication of anesthesia     pt states"difficult to wake up"  . PONV (postoperative nausea and vomiting)     "scop patch placed and does not work"   Past Surgical History  Procedure Laterality Date  . Abdominal hysterectomy      partical  . Breast surgery  1990    lumpectomy - right  . Ganglion cyst excision  2002 - approximate  . Stapedes surgery Right 2011  . Total mastectomy Right 04/14/2013    Procedure: TOTAL MASTECTOMY;  Surgeon: Haywood Lasso, MD;  Location: [place];  Service: General;  Laterality: Right;   History   Social History  . Marital Status: Single    Spouse Name: N/A    Number of Children: 1   Occupational History  . [employer] - distribution clerk   Social History Main Topics  . Smoking status: Never Smoker   . Smokeless tobacco: Never Used  . Alcohol Use: No  . Drug Use: No   Current Outpatient Prescriptions on File Prior to Visit  Medication Sig Dispense Refill  . cephALEXin (KEFLEX) 500 MG capsule Take 1 capsule (500 mg total) by  mouth 4 (four) times daily.  28 capsule  0  . cholecalciferol (VITAMIN D) 1000 UNITS tablet Take 1,000 Units by mouth daily.       No current facility-administered medications on file prior to visit.   No Known Allergies Family History  Problem Relation Age of Onset  . Cancer Mother     uterine or ovarian cancer  . Heart disease Father   . Cancer Sister     maternal half sister with uterine or cervical cancer; died in her 48s  . Cancer Maternal Uncle     unknown cancer  . Cancer Cousin     3 maternal cousins with unknown cancers   PE: BP 138/98  Pulse 83  Temp(Src) 98.3 F (36.8 C) (Oral)  Resp 12  Ht '5\' 6"'  (1.676 m)  Wt 153 lb 9.6 oz (69.673 kg)  BMI 24.80  kg/m2  SpO2 97% Wt Readings from Last 3 Encounters:  03/30/14 153 lb 9.6 oz (69.673 kg)  03/13/14 154 lb 3.2 oz (69.945 kg)  03/07/14 151 lb 9.6 oz (68.765 kg)   Constitutional:normal weight, in NAD Eyes: PERRLA, EOMI, no exophthalmos, no lid lag, no stare ENT: moist mucous membranes, no thyromegaly, no thyroid bruits, no cervical lymphadenopathy Cardiovascular: RRR, No MRG Respiratory: CTA B Gastrointestinal: abdomen soft, NT, ND, BS+ Musculoskeletal: no deformities, strength intact in all 4 Skin: moist, warm, no rashes Neurological: + tremors with outstretched hands, DTR normal in all 4  ASSESSMENT: 1. Low TSH  PLAN:  1. Patient with a recently found low TSH (but apparently a long-standing problem), without clear thyrotoxic sxs, but with an episode of extreme fatigue (possibly 2/2 UTI) >> now resolved.  - she does not appear to have exogenous causes for the low TSH.  - We discussed that possible causes of thyrotoxicosis are:  Graves ds   Thyroiditis toxic multinodular goiter/ toxic adenoma (I cannot feel nodules at palpation of his thyroid). - I suggested that we check the TSH, fT3 and fT4 and also had thyroid stimulating antibodies to screen for Graves' disease.  - If the tests remain abnormal, I will obtain an uptake and scan to differentiate between the 3 above possible etiologies  - we discussed about possible modalities of treatment for the above conditions, to include methimazole use, radioactive iodine ablation (surgery last resort). - we might need to do thyroid ultrasound depending on the results of the uptake and scan (if a cold nodule is present) - I do not feel that we need to add beta blockers at this time, since she is not tachycardic or anxious. - I advised her to join my chart to communicate easier - RTC in 3 months, but likely sooner for repeat labs  Component     Latest Ref Rng 03/07/2014 03/30/2014  TSH     0.35 - 4.50 uIU/mL 0.284 (L) 0.13 (L)  Free T4      0.60 - 1.60 ng/dL 0.89 0.75  T3, Free     2.3 - 4.2 pg/mL 1.6 (L) 3.0  TSI     <140 % baseline  33   TSH lower. Since she feels much better >> will recheck tests in 1.5 mo.

## 2014-04-04 LAB — THYROID STIMULATING IMMUNOGLOBULIN: TSI: 33 % baseline (ref <140)

## 2014-05-08 ENCOUNTER — Encounter: Payer: Self-pay | Admitting: Nurse Practitioner

## 2014-05-08 ENCOUNTER — Telehealth: Payer: Self-pay | Admitting: Nurse Practitioner

## 2014-05-08 ENCOUNTER — Ambulatory Visit (HOSPITAL_BASED_OUTPATIENT_CLINIC_OR_DEPARTMENT_OTHER): Payer: Federal, State, Local not specified - PPO | Admitting: Nurse Practitioner

## 2014-05-08 ENCOUNTER — Ambulatory Visit: Payer: Federal, State, Local not specified - PPO | Admitting: Internal Medicine

## 2014-05-08 ENCOUNTER — Other Ambulatory Visit: Payer: Self-pay | Admitting: Internal Medicine

## 2014-05-08 ENCOUNTER — Other Ambulatory Visit: Payer: Federal, State, Local not specified - PPO

## 2014-05-08 ENCOUNTER — Other Ambulatory Visit (HOSPITAL_BASED_OUTPATIENT_CLINIC_OR_DEPARTMENT_OTHER): Payer: Federal, State, Local not specified - PPO

## 2014-05-08 VITALS — BP 156/77 | HR 70 | Temp 98.1°F | Resp 20 | Ht 66.0 in | Wt 163.8 lb

## 2014-05-08 DIAGNOSIS — C50211 Malignant neoplasm of upper-inner quadrant of right female breast: Secondary | ICD-10-CM

## 2014-05-08 DIAGNOSIS — Z853 Personal history of malignant neoplasm of breast: Secondary | ICD-10-CM

## 2014-05-08 LAB — CBC WITH DIFFERENTIAL/PLATELET
BASO%: 0.3 % (ref 0.0–2.0)
BASOS ABS: 0 10*3/uL (ref 0.0–0.1)
EOS ABS: 0.3 10*3/uL (ref 0.0–0.5)
EOS%: 3.3 % (ref 0.0–7.0)
HEMATOCRIT: 41.4 % (ref 34.8–46.6)
HEMOGLOBIN: 13.4 g/dL (ref 11.6–15.9)
LYMPH#: 3.1 10*3/uL (ref 0.9–3.3)
LYMPH%: 39.3 % (ref 14.0–49.7)
MCH: 25.8 pg (ref 25.1–34.0)
MCHC: 32.4 g/dL (ref 31.5–36.0)
MCV: 79.8 fL (ref 79.5–101.0)
MONO#: 0.4 10*3/uL (ref 0.1–0.9)
MONO%: 5.3 % (ref 0.0–14.0)
NEUT%: 51.8 % (ref 38.4–76.8)
NEUTROS ABS: 4.1 10*3/uL (ref 1.5–6.5)
Platelets: 344 10*3/uL (ref 145–400)
RBC: 5.19 10*6/uL (ref 3.70–5.45)
RDW: 15.4 % — ABNORMAL HIGH (ref 11.2–14.5)
WBC: 7.9 10*3/uL (ref 3.9–10.3)

## 2014-05-08 LAB — COMPREHENSIVE METABOLIC PANEL (CC13)
ALT: 21 U/L (ref 0–55)
ANION GAP: 12 meq/L — AB (ref 3–11)
AST: 20 U/L (ref 5–34)
Albumin: 3.8 g/dL (ref 3.5–5.0)
Alkaline Phosphatase: 85 U/L (ref 40–150)
BUN: 9.1 mg/dL (ref 7.0–26.0)
CALCIUM: 9.6 mg/dL (ref 8.4–10.4)
CHLORIDE: 106 meq/L (ref 98–109)
CO2: 23 meq/L (ref 22–29)
Creatinine: 0.8 mg/dL (ref 0.6–1.1)
Glucose: 106 mg/dl (ref 70–140)
Potassium: 3.7 mEq/L (ref 3.5–5.1)
Sodium: 141 mEq/L (ref 136–145)
Total Bilirubin: 0.29 mg/dL (ref 0.20–1.20)
Total Protein: 7.9 g/dL (ref 6.4–8.3)

## 2014-05-08 LAB — T3, FREE: T3, Free: 3.4 pg/mL (ref 2.3–4.2)

## 2014-05-08 LAB — T4, FREE: FREE T4: 0.81 ng/dL (ref 0.80–1.80)

## 2014-05-08 LAB — TSH: TSH: 1.029 u[IU]/mL (ref 0.350–4.500)

## 2014-05-08 NOTE — Progress Notes (Signed)
ID: Pamela Summers OB: 08-27-1950  MR#: [id number]  JTT#:[id number]  PCP: Shirline Frees, MD GYN:  Delila Pereyra SU: [facility] [place]] OTHER MD: Ulyess Blossom,  Merry Proud [facility]   CHIEF COMPLAINT: Hx of right breast cancer, status post right mastectomy  CURRENT TREATMENT: none, observation  BREAST CANCER HISTORY: The patient has a history of right upper outer quadrant breast cancer status post lumpectomy and axillary lymph node dissection in 1989, when the patient was 60 years old. She "met with a chemo doctor" but did not receive adjuvant chemotherapy or antiestrogens. She underwent adjuvant radiation under Dr Arloa Koh. I do not have those records.  On 09/22/2011 bilateral screening mammography at the breast Center showed a possible mass in the left breast additional studies felt this to have been a summation shadow where there was no palpable mass and ultrasound showed only normal tissue. Nevertheless left diagnostic mammography in 6 months was recommended and that was performed in August of 2013. This was again negative.  On 09/27/2012 the patient had bilateral screening mammography showing a possible mass in the right breast. Right diagnostic mammography and right breast ultrasound on 10/04/2012 showed a 7 mm ovoid density which by ultrasound measured 5 mm, at the 2:00 position in the right breast. The right axilla was unremarkable.  Ultrasound-guided biopsy of this mass to 06/13/2013 showed ([id number]) a ductal carcinoma in situ, grade 2 or 3, estrogen receptors 13% positive, with weak staining intensity, and progesterone receptor 3% positive, with weak staining intensity.  On 10/10/2012 she underwent bilateral breast MRI which showed a 2.4 cm area of clumped linear enhancement surrounding the recent biopsy clip there were no other suspicious areas in the right breast, and no findings of concern in the left breast or regional lymph node area.  With this information and after  appropriate discussion the patient proceeded to right simple mastectomy 04/14/2013. The final pathology from that procedure ([id number]) showed an area of 2.3 cm of ductal carcinoma in situ, high-grade. Margins were ample (1.2 cm from the deep margin was the closest).  The patient's subsequent history is as detailed below  INTERVAL HISTORY: Pamela Summers for follow up of her breast cancer. The interval history is unremarkable. Pamela Summers states that she "feels great." She is not currently exercising, but is very active in her garden.   REVIEW OF SYSTEMS: Pamela Summers denies fevers, chills, nausea, vomiting, or changes in bowel or bladder habits. She also denies shortness of breath, chest pain, or fatigue. A detailed review of systems was otherwise entirely negative.   PAST MEDICAL HISTORY: Past Medical History  Diagnosis Date  . Arthritis   . Heart murmur   . Cancer   . Hearing loss   . Breast cancer 1989, 2014  . Hypertension   . Complication of anesthesia     pt states"difficult to wake up"  . PONV (postoperative nausea and vomiting)     "scop patch placed and does not work"    PAST SURGICAL HISTORY: Past Surgical History  Procedure Laterality Date  . Abdominal hysterectomy      partical  . Breast surgery  1990    lumpectomy - right  . Ganglion cyst excision  2002 - approximate  . Stapedes surgery Right 2011  . Total mastectomy Right 04/14/2013    Procedure: TOTAL MASTECTOMY;  Surgeon: Haywood Lasso, MD;  Location: [facility] OR;  Service: General;  Laterality: Right;    FAMILY HISTORY Family History  Problem Relation Age of Onset  .  Cancer Mother     uterine or ovarian cancer  . Heart disease Father   . Cancer Sister     maternal half sister with uterine or cervical cancer; died in her 4s  . Cancer Maternal Uncle     unknown cancer  . Cancer Cousin     3 maternal cousins with unknown cancers   the family history is detailed extensively in the genetics consult in brief: The patient  has very little information about her father. Her mother died before the age of 2 from either uterine or cervical cancer. The patient had 6 maternal half siblings and 4 paternal half siblings. There is no history of breast or ovarian cancer in the family as far as she can tell  GYNECOLOGIC HISTORY:  Menarche age 16, first live birth age 68, the patient is GX P1. She status post simple hysterectomy, without salpingo-oophorectomy. She did not use hormone replacement.  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 She lives by herself, with no pets. Her daughter Pamela Summers lives in [place] and works in a Nurse, learning disability. The patient has 2 grandchildren. She attends a local [employer] DIRECTIVES:  in place. The patient's daughter is her healthcare power of attorney. Pamela Summers can be reached at [place]: History  Substance Use Topics  . Smoking status: Never Smoker   . Smokeless tobacco: Never Used  . Alcohol Use: No     Colonoscopy: 2010?/ [facility]  PAP: Status post hysterectomy   Bone density: At Dr.Mezer's/ "normal" and he  Lipid panel:  No Known Allergies  Current Outpatient Prescriptions  Medication Sig Dispense Refill  . atorvastatin (LIPITOR) 20 MG tablet Take 20 mg by mouth daily.      . hydrochlorothiazide (MICROZIDE) 12.5 MG capsule Take 12.5 mg by mouth daily.      . metoprolol succinate (TOPROL-XL) 25 MG 24 hr tablet Take 25 mg by mouth daily.      . cholecalciferol (VITAMIN D) 1000 UNITS tablet Take 1,000 Units by mouth daily.       No current facility-administered medications for this visit.    OBJECTIVE: Middle-aged [place] American woman in no acute distress BP  Filed Vitals:   05/08/14 0900  BP: 156/77  Pulse: 70  Temp: 98.1 F (36.7 C)  Resp: 20     Body mass index is 26.45 kg/(m^2).    ECOG FS:0 - Asymptomatic  Skin: warm, dry  HEENT: sclerae anicteric,  conjunctivae pink, oropharynx clear. No thrush or mucositis.  Lymph Nodes: No cervical or supraclavicular lymphadenopathy  Lungs: clear to auscultation bilaterally, no rales, wheezes, or rhonci  Heart: regular rate and rhythm  Abdomen: round, soft, non tender, positive bowel sounds  Musculoskeletal: No focal spinal tenderness, no peripheral edema  Neuro: non focal, well oriented, positive affect  Breasts: right breast status post mastectomy and radiation. No evidence of recurrent disease. Right axilla benign. Left breast unremarkable.   LAB RESULTS:  CMP     Component Value Date/Time   NA 133* 03/13/2014 1437   K 3.9 03/13/2014 1437   CL 95* 03/13/2014 1437   CO2 25 03/13/2014 1437   GLUCOSE 106* 03/13/2014 1437   BUN 14 03/13/2014 1437   CREATININE 0.60 03/13/2014 1437   CREATININE 0.75 03/08/2014 2131   CALCIUM 9.0 03/13/2014 1437   PROT 8.0 03/13/2014 1437   ALBUMIN 3.7 03/13/2014 1437   AST  33 03/13/2014 1437   ALT 35 03/13/2014 1437   ALKPHOS 81 03/13/2014 1437   BILITOT 0.5 03/13/2014 1437   GFRNONAA >89 03/13/2014 1437   GFRNONAA 88* 03/08/2014 2131   GFRAA >89 03/13/2014 1437   GFRAA >90 03/08/2014 2131    I No results found for this basename: SPEP,  UPEP,   kappa and lambda light chains    Lab Results  Component Value Date   WBC 7.9 05/08/2014   NEUTROABS 4.1 05/08/2014   HGB 13.4 05/08/2014   HCT 41.4 05/08/2014   MCV 79.8 05/08/2014   PLT 344 05/08/2014      Chemistry      Component Value Date/Time   NA 133* 03/13/2014 1437   K 3.9 03/13/2014 1437   CL 95* 03/13/2014 1437   CO2 25 03/13/2014 1437   BUN 14 03/13/2014 1437   CREATININE 0.60 03/13/2014 1437   CREATININE 0.75 03/08/2014 2131      Component Value Date/Time   CALCIUM 9.0 03/13/2014 1437   ALKPHOS 81 03/13/2014 1437   AST 33 03/13/2014 1437   ALT 35 03/13/2014 1437   BILITOT 0.5 03/13/2014 1437       No results found for this basename: LABCA2    No components found with this basename: [id number]    No results  found for this basename: INR,  in the last 168 hours  Urinalysis    Component Value Date/Time   COLORURINE AMBER* 03/08/2014 2350   APPEARANCEUR CLOUDY* 03/08/2014 2350   LABSPEC 1.031* 03/08/2014 2350   PHURINE 6.0 03/08/2014 2350   GLUCOSEU NEGATIVE 03/08/2014 2350   HGBUR MODERATE* 03/08/2014 2350   BILIRUBINUR negative 03/24/2014 1303   BILIRUBINUR NEGATIVE 03/08/2014 2350   KETONESUR 40* 03/08/2014 2350   PROTEINUR 30 03/24/2014 1303   PROTEINUR >300* 03/08/2014 2350   UROBILINOGEN 0.2 03/24/2014 1303   UROBILINOGEN 0.2 03/08/2014 2350   NITRITE negative 03/24/2014 1303   NITRITE NEGATIVE 03/08/2014 2350   LEUKOCYTESUR Trace 03/24/2014 1303    STUDIES: Most recent unilateral mammogram on 10/03/13 was unremarkable.  ASSESSMENT: 63 y.o. BRCA negative [place] woman  (1) status post right lumpectomy and axillary lymph node dissection in 1989 for an upper outer quadrant invasive carcinoma, status post radiation, no chemotherapy or antiestrogen therapy given  (2) status post right simple mastectomy 04/14/2013  for in upper inner quadrant 2.3 cm ductal carcinoma in situ, high-grade, estrogen receptor 13% and progesterone receptors 3% positive, both weakly staining, with ample margins  (3) the patient opted against reconstruction  PLAN: Deshondra is doing well today. She is now 1 year out from her definitive surgery with no evidence of diease recurrence. The labs were reviewed in detail with her and were stable. I encouraged Korie to become more active outside of her garden, aiming for 150 active minutes per week. She states that she will begin walking daily in her neighborhood.   Fran will continue to be followed up with observation alone for a total of 5 years. She will next Summers for labs and an office visit in 6 months. Her next mammogram will be due in February. Pamela Summers understands and agrees with this plan. She know a goal of treatment in her case is cure. She has been encouraged to call with any  issues that might arise before her next visit here.   Marcelino Duster, NP   05/08/2014 9:08 AM

## 2014-05-08 NOTE — Telephone Encounter (Signed)
per pof to sch pt for appt-gave pt copy of sch

## 2014-05-31 ENCOUNTER — Telehealth: Payer: Self-pay | Admitting: Internal Medicine

## 2014-06-27 ENCOUNTER — Telehealth: Payer: Self-pay | Admitting: *Deleted

## 2014-06-27 ENCOUNTER — Encounter: Payer: Self-pay | Admitting: Internal Medicine

## 2014-06-27 NOTE — Progress Notes (Signed)
Received labs drawn at [facility] on 05/08/2014: TSH 1.029 (0.35-4.5) Free T4 0.81 (0.8 1.8) Free T3 3.4 (2.3-4.2)

## 2014-06-27 NOTE — Telephone Encounter (Signed)
Called pt and advised her per Dr Arman Filter result note:  TSH 1.029 (0.35-4.5); Free T4 0.81 (0.8-1.8); Free T3 3.4 (2.3-4.2)    Pt understood and was pleased.

## 2014-08-08 ENCOUNTER — Telehealth: Payer: Self-pay | Admitting: Oncology

## 2014-08-08 NOTE — Telephone Encounter (Signed)
DUE TO HF APPT MOVED 11/13/14 LB/HF TO 8:45AM. NOT ABLE TO REACH PT BY PHONE - NEW SCHEDULE MAILED.

## 2014-09-18 ENCOUNTER — Other Ambulatory Visit: Payer: Self-pay | Admitting: General Surgery

## 2014-09-18 DIAGNOSIS — Z9011 Acquired absence of right breast and nipple: Secondary | ICD-10-CM

## 2014-09-18 DIAGNOSIS — Z1231 Encounter for screening mammogram for malignant neoplasm of breast: Secondary | ICD-10-CM

## 2014-10-09 ENCOUNTER — Ambulatory Visit: Payer: Federal, State, Local not specified - PPO

## 2014-10-10 ENCOUNTER — Ambulatory Visit
Admission: RE | Admit: 2014-10-10 | Discharge: 2014-10-10 | Disposition: A | Discharge status: Home / Self Care | Payer: Federal, State, Local not specified - PPO | Source: Ambulatory Visit | Attending: General Surgery | Admitting: General Surgery

## 2014-10-10 DIAGNOSIS — Z1231 Encounter for screening mammogram for malignant neoplasm of breast: Secondary | ICD-10-CM

## 2014-10-10 DIAGNOSIS — Z9011 Acquired absence of right breast and nipple: Secondary | ICD-10-CM

## 2014-11-13 ENCOUNTER — Encounter: Payer: Self-pay | Admitting: Nurse Practitioner

## 2014-11-13 ENCOUNTER — Ambulatory Visit (HOSPITAL_BASED_OUTPATIENT_CLINIC_OR_DEPARTMENT_OTHER): Payer: Federal, State, Local not specified - PPO | Admitting: Nurse Practitioner

## 2014-11-13 ENCOUNTER — Telehealth: Payer: Self-pay | Admitting: Nurse Practitioner

## 2014-11-13 ENCOUNTER — Other Ambulatory Visit (HOSPITAL_BASED_OUTPATIENT_CLINIC_OR_DEPARTMENT_OTHER): Payer: Federal, State, Local not specified - PPO

## 2014-11-13 VITALS — BP 161/68 | HR 57 | Temp 97.8°F | Resp 18 | Ht 66.0 in | Wt 171.6 lb

## 2014-11-13 DIAGNOSIS — Z9010 Acquired absence of unspecified breast and nipple: Secondary | ICD-10-CM

## 2014-11-13 DIAGNOSIS — C50211 Malignant neoplasm of upper-inner quadrant of right female breast: Secondary | ICD-10-CM

## 2014-11-13 DIAGNOSIS — Z853 Personal history of malignant neoplasm of breast: Secondary | ICD-10-CM

## 2014-11-13 LAB — CBC WITH DIFFERENTIAL/PLATELET
BASO%: 0.8 % (ref 0.0–2.0)
Basophils Absolute: 0.1 10*3/uL (ref 0.0–0.1)
EOS ABS: 0.2 10*3/uL (ref 0.0–0.5)
EOS%: 2.5 % (ref 0.0–7.0)
HCT: 40.9 % (ref 34.8–46.6)
HGB: 12.7 g/dL (ref 11.6–15.9)
LYMPH%: 33.5 % (ref 14.0–49.7)
MCH: 25 pg — AB (ref 25.1–34.0)
MCHC: 31.2 g/dL — ABNORMAL LOW (ref 31.5–36.0)
MCV: 80.3 fL (ref 79.5–101.0)
MONO#: 0.4 10*3/uL (ref 0.1–0.9)
MONO%: 5.6 % (ref 0.0–14.0)
NEUT#: 4.5 10*3/uL (ref 1.5–6.5)
NEUT%: 57.6 % (ref 38.4–76.8)
PLATELETS: 325 10*3/uL (ref 145–400)
RBC: 5.09 10*6/uL (ref 3.70–5.45)
RDW: 15.6 % — AB (ref 11.2–14.5)
WBC: 7.9 10*3/uL (ref 3.9–10.3)
lymph#: 2.6 10*3/uL (ref 0.9–3.3)

## 2014-11-13 LAB — COMPREHENSIVE METABOLIC PANEL (CC13)
ALBUMIN: 3.7 g/dL (ref 3.5–5.0)
ALT: 18 U/L (ref 0–55)
ANION GAP: 8 meq/L (ref 3–11)
AST: 18 U/L (ref 5–34)
Alkaline Phosphatase: 83 U/L (ref 40–150)
BILIRUBIN TOTAL: 0.29 mg/dL (ref 0.20–1.20)
BUN: 11.2 mg/dL (ref 7.0–26.0)
CALCIUM: 9.1 mg/dL (ref 8.4–10.4)
CO2: 26 meq/L (ref 22–29)
Chloride: 106 mEq/L (ref 98–109)
Creatinine: 0.8 mg/dL (ref 0.6–1.1)
EGFR: 89 mL/min/{1.73_m2} — AB (ref >90)
Glucose: 99 mg/dl (ref 70–140)
Potassium: 4.2 mEq/L (ref 3.5–5.1)
Sodium: 141 mEq/L (ref 136–145)
TOTAL PROTEIN: 7.5 g/dL (ref 6.4–8.3)

## 2014-11-13 NOTE — Telephone Encounter (Signed)
per pof ot sch pt appt-gave pt copy of sch °

## 2014-11-13 NOTE — Progress Notes (Signed)
ID: Pamela Summers OB: 1951-03-25  MR#: [id number]  WCH#:[id number]  PCP: Shirline Frees, MD GYN:  Delila Pereyra SU: [place] [facility]] OTHER MD: Ulyess Blossom,  Merry Proud [facility]off   CHIEF COMPLAINT: Hx of right breast cancer, status post right mastectomy  CURRENT TREATMENT: none, observation  BREAST CANCER HISTORY: The patient has a history of right upper outer quadrant breast cancer status post lumpectomy and axillary lymph node dissection in 1989, when the patient was 64 years old. She "met with a chemo doctor" but did not receive adjuvant chemotherapy or antiestrogens. She underwent adjuvant radiation under Dr Arloa Koh. I do not have those records.  On 09/22/2011 bilateral screening mammography at the breast Center showed a possible mass in the left breast additional studies felt this to have been a summation shadow where there was no palpable mass and ultrasound showed only normal tissue. Nevertheless left diagnostic mammography in 6 months was recommended and that was performed in August of 2013. This was again negative.  On 09/27/2012 the patient had bilateral screening mammography showing a possible mass in the right breast. Right diagnostic mammography and right breast ultrasound on 10/04/2012 showed a 7 mm ovoid density which by ultrasound measured 5 mm, at the 2:00 position in the right breast. The right axilla was unremarkable.  Ultrasound-guided biopsy of this mass to 06/13/2013 showed ([id number]) a ductal carcinoma in situ, grade 2 or 3, estrogen receptors 13% positive, with weak staining intensity, and progesterone receptor 3% positive, with weak staining intensity.  On 10/10/2012 she underwent bilateral breast MRI which showed a 2.4 cm area of clumped linear enhancement surrounding the recent biopsy clip there were no other suspicious areas in the right breast, and no findings of concern in the left breast or regional lymph node area.  With this information and after  appropriate discussion the patient proceeded to right simple mastectomy 04/14/2013. The final pathology from that procedure ([id number]) showed an area of 2.3 cm of ductal carcinoma in situ, high-grade. Margins were ample (1.2 cm from the deep margin was the closest).  The patient's subsequent history is as detailed below  INTERVAL HISTORY: Pamela Summers returns today for ollow up of her breast cancer. She has nothing to add to her interval history. She does note that she has gained a few pounds on the scale, but has not been active much outside of her garden and eats ice cream several times a week.   REVIEW OF SYSTEMS: Pamela Summers denies fevers, chills, nausea, vomiting, or changes in bowel or bladder habits. She has no shortness of breath, chest pain, cough or palpitations. She denies headaches, dizziness, unexplained weight loss, or night sweats. She has occasional leg pain after standing too long at work.  A detailed review of systems was otherwise entirely negative.   PAST MEDICAL HISTORY: Past Medical History  Diagnosis Date  . Arthritis   . Heart murmur   . Cancer   . Hearing loss   . Breast cancer 1989, 2014  . Hypertension   . Complication of anesthesia     pt states"difficult to wake up"  . PONV (postoperative nausea and vomiting)     "scop patch placed and does not work"    PAST SURGICAL HISTORY: Past Surgical History  Procedure Laterality Date  . Abdominal hysterectomy      partical  . Breast surgery  1990    lumpectomy - right  . Ganglion cyst excision  2002 - approximate  . Stapedes surgery Right 2011  .  Total mastectomy Right 04/14/2013    Procedure: TOTAL MASTECTOMY;  Surgeon: Haywood Lasso, MD;  Location: [facility] OR;  Service: General;  Laterality: Right;    FAMILY HISTORY Family History  Problem Relation Age of Onset  . Cancer Mother     uterine or ovarian cancer  . Heart disease Father   . Cancer Sister     maternal half sister with uterine or cervical cancer; died in  her 68s  . Cancer Maternal Uncle     unknown cancer  . Cancer Cousin     3 maternal cousins with unknown cancers   the family history is detailed extensively in the genetics consult in brief: The patient has very little information about her father. Her mother died before the age of 8 from either uterine or cervical cancer. The patient had 6 maternal half siblings and 4 paternal half siblings. There is no history of breast or ovarian cancer in the family as far as she can tell  GYNECOLOGIC HISTORY:  Menarche age 28, first live birth age 9, the patient is GX P1. She status post simple hysterectomy, without salpingo-oophorectomy. She did not use hormone replacement.  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 She lives by herself, with no pets. Her daughter Pamela Summers lives in [place] and works in a Nurse, learning disability. The patient has 2 grandchildren. She attends a local [place] DIRECTIVES:  in place. The patient's daughter is her healthcare power of attorney. Pamela Summers can be reached at [place]: History  Substance Use Topics  . Smoking status: Never Smoker   . Smokeless tobacco: Never Used  . Alcohol Use: No     Colonoscopy: 2010?/ [facility]  PAP: Status post hysterectomy   Bone density: At Dr.Mezer's/ "normal" and he  Lipid panel:  No Known Allergies  Current Outpatient Prescriptions  Medication Sig Dispense Refill  . atorvastatin (LIPITOR) 20 MG tablet Take 20 mg by mouth daily.    . cholecalciferol (VITAMIN D) 1000 UNITS tablet Take 1,000 Units by mouth daily.    . hydrochlorothiazide (MICROZIDE) 12.5 MG capsule Take 12.5 mg by mouth daily.    . metoprolol succinate (TOPROL-XL) 25 MG 24 hr tablet Take 25 mg by mouth daily.     No current facility-administered medications for this visit.    OBJECTIVE: Middle-aged [place] American woman in no acute distress BP  Filed Vitals:    11/13/14 0943  BP: 161/68  Pulse: 57  Temp: 97.8 F (36.6 C)  Resp: 18     Body mass index is 27.71 kg/(m^2).    ECOG FS:0 - Asymptomatic  Skin: warm, dry  HEENT: sclerae anicteric, conjunctivae pink, oropharynx clear. No thrush or mucositis.  Lymph Nodes: No cervical or supraclavicular lymphadenopathy  Lungs: clear to auscultation bilaterally, no rales, wheezes, or rhonci  Heart: regular rate and rhythm  Abdomen: round, soft, non tender, positive bowel sounds  Musculoskeletal: No focal spinal tenderness, no peripheral edema  Neuro: non focal, well oriented, positive affect  Breasts: right breast status post mastectomy and radiation. No suspicious masses or lesions palpated.bilateral axillae benign. Left breast unremarkable.    LAB RESULTS:  CMP     Component Value Date/Time   NA 141 11/13/2014 0924   NA 133* 03/13/2014 1437   K 4.2 11/13/2014 0924   K 3.9 03/13/2014 1437   CL 95* 03/13/2014 1437   CO2 26 11/13/2014  0924   CO2 25 03/13/2014 1437   GLUCOSE 99 11/13/2014 0924   GLUCOSE 106* 03/13/2014 1437   BUN 11.2 11/13/2014 0924   BUN 14 03/13/2014 1437   CREATININE 0.8 11/13/2014 0924   CREATININE 0.60 03/13/2014 1437   CREATININE 0.75 03/08/2014 2131   CALCIUM 9.1 11/13/2014 0924   CALCIUM 9.0 03/13/2014 1437   PROT 7.5 11/13/2014 0924   PROT 8.0 03/13/2014 1437   ALBUMIN 3.7 11/13/2014 0924   ALBUMIN 3.7 03/13/2014 1437   AST 18 11/13/2014 0924   AST 33 03/13/2014 1437   ALT 18 11/13/2014 0924   ALT 35 03/13/2014 1437   ALKPHOS 83 11/13/2014 0924   ALKPHOS 81 03/13/2014 1437   BILITOT 0.29 11/13/2014 0924   BILITOT 0.5 03/13/2014 1437   GFRNONAA >89 03/13/2014 1437   GFRNONAA 88* 03/08/2014 2131   GFRAA >89 03/13/2014 1437   GFRAA >90 03/08/2014 2131    I No results found for: SPEP  Lab Results  Component Value Date   WBC 7.9 11/13/2014   NEUTROABS 4.5 11/13/2014   HGB 12.7 11/13/2014   HCT 40.9 11/13/2014   MCV 80.3 11/13/2014   PLT 325  11/13/2014      Chemistry      Component Value Date/Time   NA 141 11/13/2014 0924   NA 133* 03/13/2014 1437   K 4.2 11/13/2014 0924   K 3.9 03/13/2014 1437   CL 95* 03/13/2014 1437   CO2 26 11/13/2014 0924   CO2 25 03/13/2014 1437   BUN 11.2 11/13/2014 0924   BUN 14 03/13/2014 1437   CREATININE 0.8 11/13/2014 0924   CREATININE 0.60 03/13/2014 1437   CREATININE 0.75 03/08/2014 2131      Component Value Date/Time   CALCIUM 9.1 11/13/2014 0924   CALCIUM 9.0 03/13/2014 1437   ALKPHOS 83 11/13/2014 0924   ALKPHOS 81 03/13/2014 1437   AST 18 11/13/2014 0924   AST 33 03/13/2014 1437   ALT 18 11/13/2014 0924   ALT 35 03/13/2014 1437   BILITOT 0.29 11/13/2014 0924   BILITOT 0.5 03/13/2014 1437       No results found for: LABCA2  No components found for: OHYWV371  No results for input(s): INR in the last 168 hours.  Urinalysis    Component Value Date/Time   COLORURINE AMBER* 03/08/2014 2350   APPEARANCEUR CLOUDY* 03/08/2014 2350   LABSPEC 1.031* 03/08/2014 2350   PHURINE 6.0 03/08/2014 2350   GLUCOSEU NEGATIVE 03/08/2014 2350   HGBUR MODERATE* 03/08/2014 2350   BILIRUBINUR negative 03/24/2014 1303   BILIRUBINUR NEGATIVE 03/08/2014 2350   KETONESUR 40* 03/08/2014 2350   PROTEINUR 30 03/24/2014 1303   PROTEINUR >300* 03/08/2014 2350   UROBILINOGEN 0.2 03/24/2014 1303   UROBILINOGEN 0.2 03/08/2014 2350   NITRITE negative 03/24/2014 1303   NITRITE NEGATIVE 03/08/2014 2350   LEUKOCYTESUR Trace 03/24/2014 1303    STUDIES: Most recent unilateral mammogram on 10/10/14 was unremarkable.  ASSESSMENT: 64 y.o. BRCA negative [place] woman  (1) status post right lumpectomy and axillary lymph node dissection in 1989 for an upper outer quadrant invasive carcinoma, status post radiation, no chemotherapy or antiestrogen therapy given  (2) status post right simple mastectomy 04/14/2013  for in upper inner quadrant 2.3 cm ductal carcinoma in situ, high-grade, estrogen receptor  13% and progesterone receptors 3% positive, both weakly staining, with ample margins  (3) the patient opted against reconstruction  PLAN: Yu continues do well as far as her breast cancer is concerned. The labs were reviewed  in detail and were entirely stable. Her most recent mammogram was benign.   Again, I advised Brad to become more active outside of her garden. If she is concerned about weight gain, she will have to be intention about losing the inches. The weather is nice now and she will consider walking more.   Mazy will return in October for labs and a follow up visit. She understands and agrees with this plan. She has been encouraged to call with any issues that might arise before her next visit here.   Laurie Panda, NP   11/13/2014 11:08 AM

## 2014-11-14 NOTE — Addendum Note (Signed)
Addended by: Marcelino Duster on: 11/14/2014 03:29 PM   Modules accepted: Orders

## 2014-11-23 NOTE — Telephone Encounter (Signed)
Error

## 2014-12-20 ENCOUNTER — Ambulatory Visit (INDEPENDENT_AMBULATORY_CARE_PROVIDER_SITE_OTHER): Payer: Federal, State, Local not specified - PPO

## 2014-12-20 ENCOUNTER — Ambulatory Visit (INDEPENDENT_AMBULATORY_CARE_PROVIDER_SITE_OTHER): Payer: Federal, State, Local not specified - PPO | Admitting: Emergency Medicine

## 2014-12-20 VITALS — BP 144/92 | HR 71 | Temp 98.3°F | Resp 18 | Ht 66.0 in | Wt 172.0 lb

## 2014-12-20 DIAGNOSIS — R05 Cough: Secondary | ICD-10-CM

## 2014-12-20 DIAGNOSIS — R059 Cough, unspecified: Secondary | ICD-10-CM

## 2014-12-20 DIAGNOSIS — J0100 Acute maxillary sinusitis, unspecified: Secondary | ICD-10-CM

## 2014-12-20 LAB — POCT CBC
GRANULOCYTE PERCENT: 53.2 % (ref 37–80)
HCT, POC: 40.5 % (ref 37.7–47.9)
HEMOGLOBIN: 12.9 g/dL (ref 12.2–16.2)
Lymph, poc: 3.1 (ref 0.6–3.4)
MCH: 25.3 pg — AB (ref 27–31.2)
MCHC: 31.8 g/dL (ref 31.8–35.4)
MCV: 79.6 fL — AB (ref 80–97)
MID (CBC): 0.5 (ref 0–0.9)
MPV: 7.4 fL (ref 0–99.8)
PLATELET COUNT, POC: 331 10*3/uL (ref 142–424)
POC Granulocyte: 4.1 (ref 2–6.9)
POC LYMPH PERCENT: 39.8 %L (ref 10–50)
POC MID %: 7 %M (ref 0–12)
RBC: 5.09 M/uL (ref 4.04–5.48)
RDW, POC: 15.7 %
WBC: 7.7 10*3/uL (ref 4.6–10.2)

## 2014-12-20 MED ORDER — FLUTICASONE PROPIONATE 50 MCG/ACT NA SUSP: 2.0000 | Freq: Every day | NASAL | Status: DC

## 2014-12-20 MED ORDER — AMOXICILLIN 875 MG PO TABS: 875.0000 mg | ORAL_TABLET | Freq: Two times a day (BID) | ORAL | Status: DC

## 2014-12-20 MED ORDER — BENZONATATE 100 MG PO CAPS
100.0000 mg | ORAL_CAPSULE | Freq: Three times a day (TID) | ORAL | Status: DC | PRN
Start: 2014-12-20 — End: 2015-05-30

## 2014-12-20 NOTE — Progress Notes (Addendum)
   Subjective:    Patient ID: Pamela Summers, female    DOB: 12/21/1950, 64 y.o.   MRN: [id number] This chart was scribed for Pamela Queen, MD by Zola Button, Medical Scribe. This patient was seen in room 11 and the patient's care was started at 1:20 PM.   HPI HPI Comments: Pamela Summers is a 64 y.o. female who presents to the [facility] complaining of gradual onset URI symptoms that started about a week and a half ago. Patient reports having right ear pain, rhinorrhea and productive cough with thick, yellow/green mucus. She does report sick contacts at work; a man at work had been coughing. Her symptoms started with right ear pain 9 days ago. The next day, she started having rhinorrhea, congestion and cough. She did not work the following 2 days, but worked the next day.  Review of Systems  HENT: Positive for congestion, ear pain and rhinorrhea.   Respiratory: Positive for cough.        Objective:   Physical Exam CONSTITUTIONAL: Well developed/well nourished HEAD: Normocephalic/atraumatic EYES: EOM/PERRL ENMT: Significant nasal congestion. Ears normal. Throat normal. NECK: supple no meningeal signs SPINE: entire spine nontender CV: S1/S2 noted, no murmurs/rubs/gallops noted LUNGS: Occasional rhonchi in both lungs. No wheezes. Good air exchange. ABDOMEN: soft, nontender, no rebound or guarding GU: no cva tenderness NEURO: Pt is awake/alert, moves all extremitiesx4 EXTREMITIES: pulses normal, full ROM SKIN: warm, color normal there is a right mastectomy scar PSYCH: no abnormalities of mood noted [facility] reading (PRIMARY) by  Dr.Cristyn Crossno appears to be borderline cardiomegaly. There is no change from previous. There are no pneumonic infiltrates. There is right breast surgical scars Results for orders placed or performed in visit on 12/20/14  POCT CBC  Result Value Ref Range   WBC 7.7 4.6 - 10.2 K/uL   Lymph, poc 3.1 0.6 - 3.4   POC LYMPH PERCENT 39.8 10 - 50 %L   MID (cbc) 0.5  0 - 0.9   POC MID % 7.0 0 - 12 %M   POC Granulocyte 4.1 2 - 6.9   Granulocyte percent 53.2 37 - 80 %G   RBC 5.09 4.04 - 5.48 M/uL   Hemoglobin 12.9 12.2 - 16.2 g/dL   HCT, POC 40.5 37.7 - 47.9 %   MCV 79.6 (A) 80 - 97 fL   MCH, POC 25.3 (A) 27 - 31.2 pg   MCHC 31.8 31.8 - 35.4 g/dL   RDW, POC 15.7 %   Platelet Count, POC 331 142 - 424 K/uL   MPV 7.4 0 - 99.8 fL       Assessment & Plan:  1. Cough  - POCT CBC - DG Chest 2 View; Future  2. Acute maxillary sinusitis, recurrence not specified We'll treat with amoxicillin and Mucinex Tessalon and Flonase.   I personally performed the services described in this documentation, which was scribed in my presence. The recorded information has been reviewed and is accurate.  Pamela Queen, MD  [facility]  12/20/2014 2:08 PM

## 2014-12-20 NOTE — Patient Instructions (Signed)

## 2014-12-21 ENCOUNTER — Other Ambulatory Visit: Payer: Self-pay | Admitting: Emergency Medicine

## 2014-12-21 DIAGNOSIS — I517 Cardiomegaly: Secondary | ICD-10-CM

## 2014-12-26 ENCOUNTER — Encounter: Payer: Self-pay | Admitting: Interventional Cardiology

## 2014-12-26 ENCOUNTER — Ambulatory Visit (INDEPENDENT_AMBULATORY_CARE_PROVIDER_SITE_OTHER): Payer: Federal, State, Local not specified - PPO | Admitting: Interventional Cardiology

## 2014-12-26 VITALS — BP 128/90 | HR 73 | Ht 66.0 in | Wt 168.8 lb

## 2014-12-26 DIAGNOSIS — I517 Cardiomegaly: Secondary | ICD-10-CM

## 2014-12-26 DIAGNOSIS — I10 Essential (primary) hypertension: Secondary | ICD-10-CM | POA: Insufficient documentation

## 2014-12-26 NOTE — Patient Instructions (Signed)
Medication Instructions:  Your physician recommends that you continue on your current medications as directed. Please refer to the Current Medication list given to you today.   Labwork: None   Testing/Procedures: Your physician has requested that you have an echocardiogram. Echocardiography is a painless test that uses sound waves to create images of your heart. It provides your doctor with information about the size and shape of your heart and how well your heart's chambers and valves are working. This procedure takes approximately one hour. There are no restrictions for this procedure.   Follow-Up: Your physician recommends that you schedule a follow-up appointment an needed  Any Other Special Instructions Will Be Listed Below (If Applicable).

## 2014-12-26 NOTE — Progress Notes (Signed)
Cardiology Office Note   Date:  12/26/2014   ID:  Pamela Summers, DOB 08/27/1950, MRN [id number]  PCP:  Shirline Frees, MD  Cardiologist:   Sinclair Grooms, MD   Chief Complaint  Patient presents with  . Establish Care    Enlarge heart      History of Present Illness: Pamela Summers is a 64 y.o. female who presents for cardiac enlargement on chest x-ray.  I'll see Pamela Summers in the past. She has had prior evaluation with no significant cardiac problems having been found. A recent chest x-ray identified "cardiac enlargement". In [facility], heart size appears about the same over that timeframe. Occasionally cardiomegaly as mentioned and other times not. She has no heart failure symptoms. She specifically denies orthopnea, PND, edema, exertional dyspnea, chest pain, and other cardiac complaints.    Past Medical History  Diagnosis Date  . Arthritis   . Heart murmur   . Cancer   . Hearing loss   . Breast cancer 1989, 2014  . Hypertension   . Complication of anesthesia     pt states"difficult to wake up"  . PONV (postoperative nausea and vomiting)     "scop patch placed and does not work"    Past Surgical History  Procedure Laterality Date  . Abdominal hysterectomy      partical  . Breast surgery  1990    lumpectomy - right  . Ganglion cyst excision  2002 - approximate  . Stapedes surgery Right 2011  . Total mastectomy Right 04/14/2013    Procedure: TOTAL MASTECTOMY;  Surgeon: Haywood Lasso, MD;  Location: [place];  Service: General;  Laterality: Right;     Current Outpatient Prescriptions  Medication Sig Dispense Refill  . amoxicillin (AMOXIL) 875 MG tablet Take 1 tablet (875 mg total) by mouth 2 (two) times daily. 20 tablet 0  . atorvastatin (LIPITOR) 20 MG tablet Take 20 mg by mouth daily.    . benzonatate (TESSALON) 100 MG capsule Take 1-2 capsules (100-200 mg total) by mouth 3 (three) times daily as needed for cough.  40 capsule 0  . cholecalciferol (VITAMIN D) 1000 UNITS tablet Take 1,000 Units by mouth daily.    . fluticasone (FLONASE) 50 MCG/ACT nasal spray Place 2 sprays into both nostrils 2 (two) times daily as needed for allergies or rhinitis.    . hydrochlorothiazide (MICROZIDE) 12.5 MG capsule Take 12.5 mg by mouth daily.    . metoprolol succinate (TOPROL-XL) 25 MG 24 hr tablet Take 25 mg by mouth daily.     No current facility-administered medications for this visit.    Allergies:   Review of patient's allergies indicates no known allergies.    Social History:  The patient  reports that she has never smoked. She has never used smokeless tobacco. She reports that she does not drink alcohol or use illicit drugs.   Family History:  The patient's  family history includes Cancer in her cousin, maternal uncle, mother, and sister; Heart disease in her father.    ROS:  Please see the history of present illness.   Otherwise, review of systems are positive for recurrent breast cancer, right side, 2013. She has done well since that time. Occasional back discomfort..   All other systems are reviewed and negative.    PHYSICAL EXAM: VS:  BP 128/90 mmHg  Pulse 73  Ht 5\' 6"  (1.676 m)  Wt 168 lb 12.8 oz (76.567  kg)  BMI 27.26 kg/m2 , BMI Body mass index is 27.26 kg/(m^2). GEN: Well nourished, well developed, in no acute distress HEENT: normal Neck: no JVD, carotid bruits, or masses Cardiac: RRR; no murmurs, rubs, or gallops,no edema  Respiratory:  clear to auscultation bilaterally, normal work of breathing GI: soft, nontender, nondistended, + BS MS: no deformity or atrophy Skin: warm and dry, no rash Neuro:  Strength and sensation are intact Psych: euthymic mood, full affect   EKG:  EKG is ordered today. The ekg ordered today demonstrates normal sinus rhythm with biatrial abnormality and nonspecific T-wave abnormality. Recent Labs: 05/08/2014: TSH 1.029 11/13/2014: ALT 18; BUN 11.2; Creatinine  0.8; Platelets 325; Potassium 4.2; Sodium 141 12/20/2014: Hemoglobin 12.9    Lipid Panel No results found for: CHOL, TRIG, HDL, CHOLHDL, VLDL, LDLCALC, LDLDIRECT    Wt Readings from Last 3 Encounters:  12/26/14 168 lb 12.8 oz (76.567 kg)  12/20/14 172 lb (78.019 kg)  11/13/14 171 lb 9.6 oz (77.837 kg)      Other studies Reviewed: Additional studies/ records that were reviewed today include: We have requested records from [facility] including the prior echocardiogram.. Review of the above records demonstrates: Records from [place] demonstrate that the prior office visit was for elevated blood pressure and cardiomegaly. An echo is not obtained. EKGs are similar. Last evaluation last office visit [facility] was 2013.    ASSESSMENT AND PLAN:  Cardiomegaly - we need to exclude hypertensive heart disease with left ventricular hypertrophy. Plan: EKG 12-Lead, Echocardiogram  Essential hypertension, controlled on the current medical regimen based upon today's values.   Current medicines are reviewed at length with the patient today.  The patient does not have concerns regarding medicines.  The following changes have been made:  no change  Labs/ tests ordered today include:   Orders Placed This Encounter  Procedures  . EKG 12-Lead  . Echocardiogram     Disposition:   FU with HS in 1 PRN if needed.  Signed, Sinclair Grooms, MD  12/26/2014 3:04 PM    [facility] [place], [place], [place]  [address] Phone: [phone]; Fax: [phone]

## 2015-01-01 ENCOUNTER — Ambulatory Visit (HOSPITAL_COMMUNITY): Payer: Federal, State, Local not specified - PPO | Attending: Interventional Cardiology

## 2015-01-01 ENCOUNTER — Other Ambulatory Visit: Payer: Self-pay

## 2015-01-01 DIAGNOSIS — I517 Cardiomegaly: Secondary | ICD-10-CM | POA: Diagnosis not present

## 2015-01-01 DIAGNOSIS — I340 Nonrheumatic mitral (valve) insufficiency: Secondary | ICD-10-CM | POA: Diagnosis not present

## 2015-01-01 DIAGNOSIS — I313 Pericardial effusion (noninflammatory): Secondary | ICD-10-CM | POA: Insufficient documentation

## 2015-01-02 ENCOUNTER — Telehealth: Payer: Self-pay

## 2015-01-02 DIAGNOSIS — I313 Pericardial effusion (noninflammatory): Secondary | ICD-10-CM

## 2015-01-02 DIAGNOSIS — I3139 Other pericardial effusion (noninflammatory): Secondary | ICD-10-CM

## 2015-01-02 MED ORDER — COLCHICINE 0.6 MG PO TABS: 0.6000 mg | ORAL_TABLET | Freq: Two times a day (BID) | ORAL | Status: DC

## 2015-01-02 NOTE — Telephone Encounter (Signed)
Pt aware of echo results and Dr.Smith's recommendations. There is fluid in the sac around the heart causing the heart shadow to be increased. The echo needs to be repeated in one months.    start colchicine 0.6mg  bid for a month. Rx sent to pt pharmacy. Adv pt a scheduler from our office will call her to scheduled repeat echo in 1 month. Pt request a copy of echo results. Results mailed to pt

## 2015-02-05 ENCOUNTER — Ambulatory Visit (HOSPITAL_COMMUNITY): Payer: Federal, State, Local not specified - PPO | Attending: Cardiology

## 2015-02-05 ENCOUNTER — Other Ambulatory Visit: Payer: Self-pay

## 2015-02-05 DIAGNOSIS — I313 Pericardial effusion (noninflammatory): Secondary | ICD-10-CM | POA: Diagnosis present

## 2015-02-05 DIAGNOSIS — I3139 Other pericardial effusion (noninflammatory): Secondary | ICD-10-CM

## 2015-02-05 DIAGNOSIS — I319 Disease of pericardium, unspecified: Secondary | ICD-10-CM

## 2015-02-05 DIAGNOSIS — I340 Nonrheumatic mitral (valve) insufficiency: Secondary | ICD-10-CM | POA: Insufficient documentation

## 2015-02-06 ENCOUNTER — Other Ambulatory Visit: Payer: Self-pay

## 2015-02-06 ENCOUNTER — Telehealth: Payer: Self-pay

## 2015-02-06 DIAGNOSIS — I313 Pericardial effusion (noninflammatory): Secondary | ICD-10-CM

## 2015-02-06 DIAGNOSIS — J90 Pleural effusion, not elsewhere classified: Secondary | ICD-10-CM

## 2015-02-06 DIAGNOSIS — I3139 Other pericardial effusion (noninflammatory): Secondary | ICD-10-CM

## 2015-02-06 NOTE — Telephone Encounter (Signed)
Pt aware of echo results and Dr.Smith's recommendations. Heart size back to normal. Probably had viral pericardial effusion which is now better. Clinical f/u in 4 months. Stop Colchicine now. Repeat echo to look for effusion in 4 months (before f/u with Dr.Smith). Adv pt a scheduler from our office will call her to schedule her echo. Recall in Epic for 62mo f/u. Pt verbalized understanding.

## 2015-02-06 NOTE — Telephone Encounter (Signed)
-----   Message from Belva Crome, MD sent at 02/05/2015  6:21 PM EDT ----- Heart size back to normal. Probably had viral pericardial effusion which is now better. Clinical f/u in 4 months. Stop Colchicine now. Repeat echo to look for effusion in 4 months (before I see patient).

## 2015-02-08 ENCOUNTER — Telehealth: Payer: Self-pay | Admitting: Interventional Cardiology

## 2015-02-08 NOTE — Telephone Encounter (Signed)
Per pt rqst detailed message left on pt voicemail regarding echo results. Pt is to call back if she has additional questions

## 2015-02-08 NOTE — Telephone Encounter (Signed)
New Message        Pt calling stating that she wants to know what caused her heart to become enlarged. She stated she forgot to ask when she spoke to [place] last time. Please call back and advise. Pt states it is ok to leave a detailed message on answering machine if she isn't home.

## 2015-02-22 ENCOUNTER — Ambulatory Visit: Payer: Federal, State, Local not specified - PPO | Admitting: Interventional Cardiology

## 2015-03-05 ENCOUNTER — Other Ambulatory Visit: Payer: Self-pay | Admitting: Gynecology

## 2015-03-06 LAB — CYTOLOGY - PAP

## 2015-05-30 ENCOUNTER — Encounter: Payer: Self-pay | Admitting: Interventional Cardiology

## 2015-05-30 ENCOUNTER — Ambulatory Visit (HOSPITAL_COMMUNITY): Payer: Federal, State, Local not specified - PPO | Attending: Cardiology

## 2015-05-30 ENCOUNTER — Other Ambulatory Visit: Payer: Self-pay | Admitting: Interventional Cardiology

## 2015-05-30 ENCOUNTER — Ambulatory Visit (INDEPENDENT_AMBULATORY_CARE_PROVIDER_SITE_OTHER): Payer: Federal, State, Local not specified - PPO | Admitting: Interventional Cardiology

## 2015-05-30 ENCOUNTER — Other Ambulatory Visit: Payer: Self-pay

## 2015-05-30 VITALS — BP 132/92 | HR 65 | Ht 66.0 in | Wt 165.8 lb

## 2015-05-30 DIAGNOSIS — I10 Essential (primary) hypertension: Secondary | ICD-10-CM | POA: Diagnosis not present

## 2015-05-30 DIAGNOSIS — I3139 Other pericardial effusion (noninflammatory): Secondary | ICD-10-CM

## 2015-05-30 DIAGNOSIS — I313 Pericardial effusion (noninflammatory): Secondary | ICD-10-CM

## 2015-05-30 DIAGNOSIS — I319 Disease of pericardium, unspecified: Secondary | ICD-10-CM

## 2015-05-30 NOTE — Progress Notes (Signed)
Cardiology Office Note   Date:  05/30/2015   ID:  Pamela Summers, DOB [birth year], MRN [id number]  PCP:  Shirline Frees, MD  Cardiologist:  Sinclair Grooms, MD   Chief Complaint  Patient presents with  . Heart Problem    Pericardial effusion      History of Present Illness: Pamela Summers is a 64 y.o. female who presents for follow-up of pericardial effusion. Treated for 4 weeks with colchicine. Totally asymptomatic pre-and post therapy. Currently denies dyspnea on exertion, orthopnea, PND, lower extremity edema. Had echo earlier this morning that is not yet available. Recognized to have a problem when chest x-ray revealed cardiomegaly.    Past Medical History  Diagnosis Date  . Arthritis   . Heart murmur   . Cancer ([place])   . Hearing loss   . Breast cancer ([place]) 1989, 2014  . Hypertension   . Complication of anesthesia     pt states"difficult to wake up"  . PONV (postoperative nausea and vomiting)     "scop patch placed and does not work"    Past Surgical History  Procedure Laterality Date  . Abdominal hysterectomy      partical  . Breast surgery  1990    lumpectomy - right  . Ganglion cyst excision  2002 - approximate  . Stapedes surgery Right 2011  . Total mastectomy Right 04/14/2013    Procedure: TOTAL MASTECTOMY;  Surgeon: Haywood Lasso, MD;  Location: [facility];  Service: General;  Laterality: Right;     Current Outpatient Prescriptions  Medication Sig Dispense Refill  . atorvastatin (LIPITOR) 20 MG tablet Take 20 mg by mouth daily.    . cholecalciferol (VITAMIN D) 1000 UNITS tablet Take 1,000 Units by mouth daily.    . hydrochlorothiazide (MICROZIDE) 12.5 MG capsule Take 12.5 mg by mouth daily.    . metoprolol succinate (TOPROL-XL) 25 MG 24 hr tablet Take 25 mg by mouth daily.     No current facility-administered medications for this visit.    Allergies:   Review of patient's allergies indicates no known allergies.    Social History:  The patient   reports that she has never smoked. She has never used smokeless tobacco. She reports that she does not drink alcohol or use illicit drugs.   Family History:  The patient's family history includes Cancer in her cousin, maternal uncle, mother, and sister; Heart disease in her father.    ROS:  Please see the history of present illness.   Otherwise, review of systems are positive for none.   All other systems are reviewed and negative.    PHYSICAL EXAM: VS:  BP 132/92 mmHg  Pulse 65  Ht 5\' 6"  (1.676 m)  Wt 75.206 kg (165 lb 12.8 oz)  BMI 26.77 kg/m2  SpO2 97% , BMI Body mass index is 26.77 kg/(m^2). GEN: Well nourished, well developed, in no acute distress HEENT: normal Neck: no JVD, carotid bruits, or masses Cardiac: RRR.  There is no murmur, rub, or gallop. There is no edema. Respiratory:  clear to auscultation bilaterally, normal work of breathing. GI: soft, nontender, nondistended, + BS MS: no deformity or atrophy Skin: warm and dry, no rash Neuro:  Strength and sensation are intact Psych: euthymic mood, full affect   EKG:  EKG is not ordered today.   Recent Labs: 11/13/2014: ALT 18; BUN 11.2; Creatinine 0.8; Platelets 325; Potassium 4.2; Sodium 141 12/20/2014: Hemoglobin 12.9    Lipid Panel No results found  for: CHOL, TRIG, HDL, CHOLHDL, VLDL, LDLCALC, LDLDIRECT    Wt Readings from Last 3 Encounters:  05/30/15 75.206 kg (165 lb 12.8 oz)  12/26/14 76.567 kg (168 lb 12.8 oz)  12/20/14 78.019 kg (172 lb)      Other studies Reviewed: Additional studies/ records that were reviewed today include: None. The findings include none.    ASSESSMENT AND PLAN:  1. Pericardial effusion Follow-up echo is pending. No symptoms.  2. Essential hypertension Well controlled.    Current medicines are reviewed at length with the patient today.  The patient has the following concerns regarding medicines: .Marland Kitchen  The following changes/actions have been instituted:    Review echo and  call patient results. If per Glendell Docker effusion is completely resolved, no future follow-up will be necessary.  Labs/ tests ordered today include:  No orders of the defined types were placed in this encounter.     Disposition:   FU with HS in prn    Signed, Sinclair Grooms, MD  05/30/2015 10:50 AM    [address], [place], [place]  [address] Phone: [phone]; Fax: [phone]

## 2015-05-30 NOTE — Patient Instructions (Signed)
Medication Instructions:  Your physician recommends that you continue on your current medications as directed. Please refer to the Current Medication list given to you today.   Labwork: None ordered  Testing/Procedures: None ordered  Follow-Up: Your physician recommends that you schedule a follow-up appointment as needed  We will call you with your echo results   Any Other Special Instructions Will Be Listed Below (If Applicable).

## 2015-05-31 ENCOUNTER — Telehealth: Payer: Self-pay

## 2015-05-31 NOTE — Telephone Encounter (Signed)
-----   Message from Belva Crome, MD sent at 05/31/2015  7:14 AM EDT ----- Let her know that the pericardium is normal. No worries identified.

## 2015-05-31 NOTE — Telephone Encounter (Signed)
Pt aware of echo results with verbal understanding. Let her know that the pericardium is normal. No worries identified Pt will f/u with Dr.Smith as needed

## 2015-06-18 ENCOUNTER — Telehealth: Payer: Self-pay | Admitting: Nurse Practitioner

## 2015-06-18 ENCOUNTER — Other Ambulatory Visit (HOSPITAL_BASED_OUTPATIENT_CLINIC_OR_DEPARTMENT_OTHER): Payer: Federal, State, Local not specified - PPO

## 2015-06-18 ENCOUNTER — Encounter: Payer: Self-pay | Admitting: Nurse Practitioner

## 2015-06-18 ENCOUNTER — Ambulatory Visit (HOSPITAL_BASED_OUTPATIENT_CLINIC_OR_DEPARTMENT_OTHER): Payer: Federal, State, Local not specified - PPO | Admitting: Nurse Practitioner

## 2015-06-18 VITALS — BP 149/81 | HR 65 | Temp 98.9°F | Resp 18 | Ht 66.0 in | Wt 164.1 lb

## 2015-06-18 DIAGNOSIS — Z853 Personal history of malignant neoplasm of breast: Secondary | ICD-10-CM

## 2015-06-18 DIAGNOSIS — C50211 Malignant neoplasm of upper-inner quadrant of right female breast: Secondary | ICD-10-CM

## 2015-06-18 LAB — COMPREHENSIVE METABOLIC PANEL (CC13)
ALK PHOS: 93 U/L (ref 40–150)
ALT: 24 U/L (ref 0–55)
AST: 21 U/L (ref 5–34)
Albumin: 3.8 g/dL (ref 3.5–5.0)
Anion Gap: 8 mEq/L (ref 3–11)
BUN: 11.5 mg/dL (ref 7.0–26.0)
CHLORIDE: 108 meq/L (ref 98–109)
CO2: 26 mEq/L (ref 22–29)
CREATININE: 0.9 mg/dL (ref 0.6–1.1)
Calcium: 9.7 mg/dL (ref 8.4–10.4)
EGFR: 81 mL/min/{1.73_m2} — ABNORMAL LOW (ref >90)
Glucose: 109 mg/dl (ref 70–140)
Potassium: 4.2 mEq/L (ref 3.5–5.1)
Sodium: 142 mEq/L (ref 136–145)
TOTAL PROTEIN: 7.7 g/dL (ref 6.4–8.3)
Total Bilirubin: 0.34 mg/dL (ref 0.20–1.20)

## 2015-06-18 LAB — CBC WITH DIFFERENTIAL/PLATELET
BASO%: 0.7 % (ref 0.0–2.0)
Basophils Absolute: 0.1 10*3/uL (ref 0.0–0.1)
EOS%: 3.3 % (ref 0.0–7.0)
Eosinophils Absolute: 0.3 10*3/uL (ref 0.0–0.5)
HEMATOCRIT: 42.8 % (ref 34.8–46.6)
HEMOGLOBIN: 13.9 g/dL (ref 11.6–15.9)
LYMPH#: 2.8 10*3/uL (ref 0.9–3.3)
LYMPH%: 34.9 % (ref 14.0–49.7)
MCH: 26 pg (ref 25.1–34.0)
MCHC: 32.4 g/dL (ref 31.5–36.0)
MCV: 80.1 fL (ref 79.5–101.0)
MONO#: 0.4 10*3/uL (ref 0.1–0.9)
MONO%: 5.3 % (ref 0.0–14.0)
NEUT%: 55.8 % (ref 38.4–76.8)
NEUTROS ABS: 4.5 10*3/uL (ref 1.5–6.5)
Platelets: 329 10*3/uL (ref 145–400)
RBC: 5.34 10*6/uL (ref 3.70–5.45)
RDW: 15.3 % — AB (ref 11.2–14.5)
WBC: 8.1 10*3/uL (ref 3.9–10.3)

## 2015-06-18 NOTE — Telephone Encounter (Signed)
Patient did not stop by scheduling,appointments made and avs mailed to patient °

## 2015-06-18 NOTE — Progress Notes (Signed)
ID: Pamela Summers OB: 05-04-1951  MR#: [id number]  WUJ#:[id number]  PCP: Shirline Frees, MD GYN:  Delila Pereyra SU: [facility]] OTHER MD: Ulyess Blossom,  Merry Proud [facility]   CHIEF COMPLAINT: Hx of right breast cancer, status post right mastectomy  CURRENT TREATMENT: none, observation  BREAST CANCER HISTORY: The patient has a history of right upper outer quadrant breast cancer status post lumpectomy and axillary lymph node dissection in 1989, when the patient was 53 years old. She "met with a chemo doctor" but did not receive adjuvant chemotherapy or antiestrogens. She underwent adjuvant radiation under Dr Arloa Koh. I do not have those records.  On 09/22/2011 bilateral screening mammography at the breast Center showed a possible mass in the left breast additional studies felt this to have been a summation shadow where there was no palpable mass and ultrasound showed only normal tissue. Nevertheless left diagnostic mammography in 6 months was recommended and that was performed in August of 2013. This was again negative.  On 09/27/2012 the patient had bilateral screening mammography showing a possible mass in the right breast. Right diagnostic mammography and right breast ultrasound on 10/04/2012 showed a 7 mm ovoid density which by ultrasound measured 5 mm, at the 2:00 position in the right breast. The right axilla was unremarkable.  Ultrasound-guided biopsy of this mass to 06/13/2013 showed ([id number]) a ductal carcinoma in situ, grade 2 or 3, estrogen receptors 13% positive, with weak staining intensity, and progesterone receptor 3% positive, with weak staining intensity.  On 10/10/2012 she underwent bilateral breast MRI which showed a 2.4 cm area of clumped linear enhancement surrounding the recent biopsy clip there were no other suspicious areas in the right breast, and no findings of concern in the left breast or regional lymph node area.  With this information and after  appropriate discussion the patient proceeded to right simple mastectomy 04/14/2013. The final pathology from that procedure ([id number]) showed an area of 2.3 cm of ductal carcinoma in situ, high-grade. Margins were ample (1.2 cm from the deep margin was the closest).  The patient's subsequent history is as detailed below  INTERVAL HISTORY: Pamela Summers returns today for follow up of her breast cancer. She is doing well today with few changes. She had sinusitis shortly after our visit this spring, but otherwise has been illness free. She has some tightness in her shoulder which she believes she injured at work at the [employer]. They now have her in another position where there are no repetitive motions on this side. She is in good spirits.   REVIEW OF SYSTEMS: A detailed review of systems was otherwise entirely negative, except where noted above.   PAST MEDICAL HISTORY: Past Medical History  Diagnosis Date  . Arthritis   . Heart murmur   . Cancer ([place])   . Hearing loss   . Breast cancer ([place]) 1989, 2014  . Hypertension   . Complication of anesthesia     pt states"difficult to wake up"  . PONV (postoperative nausea and vomiting)     "scop patch placed and does not work"    PAST SURGICAL HISTORY: Past Surgical History  Procedure Laterality Date  . Abdominal hysterectomy      partical  . Breast surgery  1990    lumpectomy - right  . Ganglion cyst excision  2002 - approximate  . Stapedes surgery Right 2011  . Total mastectomy Right 04/14/2013    Procedure: TOTAL MASTECTOMY;  Surgeon: Haywood Lasso, MD;  Location[facility];  Service: General;  Laterality: Right;    FAMILY HISTORY Family History  Problem Relation Age of Onset  . Cancer Mother     uterine or ovarian cancer  . Heart disease Father   . Cancer Sister     maternal half sister with uterine or cervical cancer; died in her 37s  . Cancer Maternal Uncle     unknown cancer  . Cancer Cousin     3 maternal cousins with  unknown cancers   the family history is detailed extensively in the genetics consult in brief: The patient has very little information about her father. Her mother died before the age of 59 from either uterine or cervical cancer. The patient had 6 maternal half siblings and 4 paternal half siblings. There is no history of breast or ovarian cancer in the family as far as she can tell  GYNECOLOGIC HISTORY:  Menarche age 71, first live birth age 20, the patient is GX P1. She status post simple hysterectomy, without salpingo-oophorectomy. She did not use hormone replacement.  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 She lives by herself, with no pets. Her daughter Pamela Summers lives in [place] and works in a Nurse, learning disability. The patient has 2 grandchildren. She attends a local [place] DIRECTIVES:  in place. The patient's daughter is her healthcare power of attorney. Pamela Summers can be reached at [place]: Social History  Substance Use Topics  . Smoking status: Never Smoker   . Smokeless tobacco: Never Used  . Alcohol Use: No     Colonoscopy: 2010?/ [facility]  PAP: Status post hysterectomy   Bone density: At Dr.Mezer's/ "normal" and he  Lipid panel:  No Known Allergies  Current Outpatient Prescriptions  Medication Sig Dispense Refill  . atorvastatin (LIPITOR) 20 MG tablet Take 20 mg by mouth daily.    . cholecalciferol (VITAMIN D) 1000 UNITS tablet Take 1,000 Units by mouth daily.    . metoprolol succinate (TOPROL-XL) 25 MG 24 hr tablet Take 25 mg by mouth daily.    . hydrochlorothiazide (MICROZIDE) 12.5 MG capsule Take 12.5 mg by mouth daily.     No current facility-administered medications for this visit.    OBJECTIVE: Middle-aged [place] American woman in no acute distress BP  Filed Vitals:   06/18/15 0918  BP: 149/81  Pulse: 65  Temp: 98.9 F (37.2 C)  Resp: 18     Body mass  index is 26.5 kg/(m^2).    ECOG FS:0 - Asymptomatic  Skin: warm, dry  HEENT: sclerae anicteric, conjunctivae pink, oropharynx clear. No thrush or mucositis.  Lymph Nodes: No cervical or supraclavicular lymphadenopathy  Lungs: clear to auscultation bilaterally, no rales, wheezes, or rhonci  Heart: regular rate and rhythm  Abdomen: round, soft, non tender, positive bowel sounds  Musculoskeletal: No focal spinal tenderness, no peripheral edema  Neuro: non focal, well oriented, positive affect  Breasts: right breast status post mastectomy and radiation. No evidence of recurrent disease. Bilateral axillae benign. Left breast unremarkable.    LAB RESULTS:  CMP     Component Value Date/Time   NA 142 06/18/2015 0907   NA 133* 03/13/2014 1437   K 4.2 06/18/2015 0907   K 3.9 03/13/2014 1437   CL 95* 03/13/2014 1437   CO2 26 06/18/2015 0907   CO2 25 03/13/2014 1437   GLUCOSE 109 06/18/2015 0907   GLUCOSE  106* 03/13/2014 1437   BUN 11.5 06/18/2015 0907   BUN 14 03/13/2014 1437   CREATININE 0.9 06/18/2015 0907   CREATININE 0.60 03/13/2014 1437   CREATININE 0.75 03/08/2014 2131   CALCIUM 9.7 06/18/2015 0907   CALCIUM 9.0 03/13/2014 1437   PROT 7.7 06/18/2015 0907   PROT 8.0 03/13/2014 1437   ALBUMIN 3.8 06/18/2015 0907   ALBUMIN 3.7 03/13/2014 1437   AST 21 06/18/2015 0907   AST 33 03/13/2014 1437   ALT 24 06/18/2015 0907   ALT 35 03/13/2014 1437   ALKPHOS 93 06/18/2015 0907   ALKPHOS 81 03/13/2014 1437   BILITOT 0.34 06/18/2015 0907   BILITOT 0.5 03/13/2014 1437   GFRNONAA >89 03/13/2014 1437   GFRNONAA 88* 03/08/2014 2131   GFRAA >89 03/13/2014 1437   GFRAA >90 03/08/2014 2131    I No results found for: SPEP  Lab Results  Component Value Date   WBC 8.1 06/18/2015   NEUTROABS 4.5 06/18/2015   HGB 13.9 06/18/2015   HCT 42.8 06/18/2015   MCV 80.1 06/18/2015   PLT 329 06/18/2015      Chemistry      Component Value Date/Time   NA 142 06/18/2015 0907   NA 133*  03/13/2014 1437   K 4.2 06/18/2015 0907   K 3.9 03/13/2014 1437   CL 95* 03/13/2014 1437   CO2 26 06/18/2015 0907   CO2 25 03/13/2014 1437   BUN 11.5 06/18/2015 0907   BUN 14 03/13/2014 1437   CREATININE 0.9 06/18/2015 0907   CREATININE 0.60 03/13/2014 1437   CREATININE 0.75 03/08/2014 2131      Component Value Date/Time   CALCIUM 9.7 06/18/2015 0907   CALCIUM 9.0 03/13/2014 1437   ALKPHOS 93 06/18/2015 0907   ALKPHOS 81 03/13/2014 1437   AST 21 06/18/2015 0907   AST 33 03/13/2014 1437   ALT 24 06/18/2015 0907   ALT 35 03/13/2014 1437   BILITOT 0.34 06/18/2015 0907   BILITOT 0.5 03/13/2014 1437       No results found for: LABCA2  No components found for: BHALP379  No results for input(s): INR in the last 168 hours.  Urinalysis    Component Value Date/Time   COLORURINE AMBER* 03/08/2014 2350   APPEARANCEUR CLOUDY* 03/08/2014 2350   LABSPEC 1.031* 03/08/2014 2350   PHURINE 6.0 03/08/2014 2350   GLUCOSEU NEGATIVE 03/08/2014 2350   HGBUR MODERATE* 03/08/2014 2350   BILIRUBINUR negative 03/24/2014 1303   BILIRUBINUR NEGATIVE 03/08/2014 2350   KETONESUR 40* 03/08/2014 2350   PROTEINUR 30 03/24/2014 1303   PROTEINUR >300* 03/08/2014 2350   UROBILINOGEN 0.2 03/24/2014 1303   UROBILINOGEN 0.2 03/08/2014 2350   NITRITE negative 03/24/2014 1303   NITRITE NEGATIVE 03/08/2014 2350   LEUKOCYTESUR Trace 03/24/2014 1303    STUDIES: Most recent unilateral mammogram on 10/10/14 was unremarkable.  ASSESSMENT: 64 y.o. BRCA negative [place] woman  (1) status post right lumpectomy and axillary lymph node dissection in 1989 for an upper outer quadrant invasive carcinoma, status post radiation, no chemotherapy or antiestrogen therapy given  (2) status post right simple mastectomy 04/14/2013  for in upper inner quadrant 2.3 cm ductal carcinoma in situ, high-grade, estrogen receptor 13% and progesterone receptors 3% positive, both weakly staining, with ample margins  (3) the  patient opted against reconstruction  PLAN: Pamela Summers looks and feels great today. She is doing well as far as her breast cancer is concerned. She is now 2 full years out from her definitive surgery with no evidence  of recurrent disease. The labs were reviewed in detail and were stable.   Danae is due for a repeat mammogram in February, she will have a follow up visit with [place] in March. From that point forward she has agreed to yearly observation visits. She understands and agrees with this plan. She has been encouraged to call with any issues that might arise before her next visit here.   Laurie Panda, NP   06/18/2015 12:09 PM

## 2015-08-23 ENCOUNTER — Ambulatory Visit (INDEPENDENT_AMBULATORY_CARE_PROVIDER_SITE_OTHER): Payer: Federal, State, Local not specified - PPO | Admitting: Family Medicine

## 2015-08-23 VITALS — BP 146/88 | HR 68 | Temp 98.4°F | Resp 16 | Ht 66.0 in | Wt 167.0 lb

## 2015-08-23 DIAGNOSIS — Z79899 Other long term (current) drug therapy: Secondary | ICD-10-CM | POA: Diagnosis not present

## 2015-08-23 DIAGNOSIS — M791 Myalgia, unspecified site: Secondary | ICD-10-CM

## 2015-08-23 LAB — CK: Total CK: 225 U/L — ABNORMAL HIGH (ref 7–177)

## 2015-08-23 NOTE — Progress Notes (Addendum)
Subjective:    Patient ID: Pamela Summers, female    DOB: 04-14-1951, 64 y.o.   MRN: [id number] By signing my name below, I, Judithe Modest, attest that this documentation has been prepared under the direction and in the presence of Merri Ray, MD. Electronically Signed: Judithe Modest, ER Scribe. 08/23/2015. 9:22 AM.  Chief Complaint  Patient presents with  . Leg Pain    Left thigh onset yesterday    HPI  HPI Comments: Pamela Summers is a 64 y.o. female who presents to [facility] complaining of a deep throbbing upper left thigh pain that started at 6:30 PM last night and continued and worsened throughout her shift. When she got off her shift at 3:30 this morning her pain was severe. She works at the [employer] and stands all night long. She denies back pain, groin pain, or knee pain. She denies saddle numbness, or loss of control of bowel or bladder. The pain is not a burning pain pain and is not near the surface. She is on Lipitor 20 mg per day. She has not taken anything, or put heat or cold on her leg. She has taken herself off of Lipitor the past due to pain.  Hx of breast cancer 1989, and 2013 with a full right mastectomy in 2014. She also has a hx of HTN and HLD.  Patient Active Problem List   Diagnosis Date Noted  . Essential hypertension 12/26/2014  . Abnormal thyroid blood test 03/30/2014  . Breast cancer of upper-inner quadrant of right female breast ([place]) 07/18/2013  . Abnormal CXR 05/19/2012   Past Medical History  Diagnosis Date  . Arthritis   . Heart murmur   . Cancer ([place])   . Hearing loss   . Breast cancer ([place]) 1989, 2014  . Hypertension   . Complication of anesthesia     pt states"difficult to wake up"  . PONV (postoperative nausea and vomiting)     "scop patch placed and does not work"   Past Surgical History  Procedure Laterality Date  . Abdominal hysterectomy      partical  . Breast surgery  1990    lumpectomy - right  . Ganglion cyst excision  2002  - approximate  . Stapedes surgery Right 2011  . Total mastectomy Right 04/14/2013    Procedure: TOTAL MASTECTOMY;  Surgeon: Haywood Lasso, MD;  Location: [facility];  Service: General;  Laterality: Right;   No Known Allergies Prior to Admission medications   Medication Sig Start Date End Date Taking? Authorizing Provider  atorvastatin (LIPITOR) 20 MG tablet Take 20 mg by mouth daily.   Yes Historical Provider, MD  cholecalciferol (VITAMIN D) 1000 UNITS tablet Take 1,000 Units by mouth daily.   Yes Historical Provider, MD  metoprolol succinate (TOPROL-XL) 25 MG 24 hr tablet Take 25 mg by mouth daily.   Yes Historical Provider, MD  hydrochlorothiazide (MICROZIDE) 12.5 MG capsule Take 12.5 mg by mouth daily. Reported on 08/23/2015    Historical Provider, MD   Social History   Social History  . Marital Status: Single    Spouse Name: N/A  . Number of Children: N/A  . Years of Education: N/A   Occupational History  . Not on file.   Social History Main Topics  . Smoking status: Never Smoker   . Smokeless tobacco: Never Used  . Alcohol Use: No  . Drug Use: No  . Sexual Activity: No   Other Topics Concern  .  Not on file   Social History Narrative    Review of Systems  Constitutional: Negative for fever and chills.  Gastrointestinal: Negative for abdominal pain and constipation.  Musculoskeletal: Positive for gait problem. Negative for back pain.      Objective:  BP 146/88 mmHg  Pulse 68  Temp(Src) 98.4 F (36.9 C) (Oral)  Resp 16  Ht 5\' 6"  (1.676 m)  Wt 167 lb (75.751 kg)  BMI 26.97 kg/m2  SpO2 98%   Physical Exam  Constitutional: She is oriented to person, place, and time. She appears well-developed and well-nourished. No distress.  HENT:  Head: Normocephalic and atraumatic.  Eyes: Pupils are equal, round, and reactive to light.  Neck: Neck supple.  Cardiovascular: Normal rate.   Pulmonary/Chest: Effort normal. No respiratory distress.  Musculoskeletal: Normal  range of motion.  L/S spin non tender, full ROM. Able to heel and toe walk without difficulty. Left hip full ROM, no pain. Non tender over the left hip and knee. Proximal left quad and upper quad TTP but the muscle is soft. No rash no redness.  Neurological: She is alert and oriented to person, place, and time. Coordination normal.  Skin: Skin is warm and dry. She is not diaphoretic.  Psychiatric: She has a normal mood and affect. Her behavior is normal.  Nursing note and vitals reviewed.     Assessment & Plan:   Pamela Summers is a 64 y.o. female Myalgia - Plan: CK, Care order/instruction  On statin therapy - Plan: CK  Acute onset of left quadriceps/5 pain without known injury. No external rash or dysesthesias. Less likely shingles. She does take Lipitor, so advised to stop statin, check CK level, trial of Aleve and relative rest for the next few days, maintain adequate fluid intake. If pain resolves, restart Lipitor every other day, then if tolerates this dosing for 2 weeks, restart daily. Return if symptoms recur.   No orders of the defined types were placed in this encounter.   Patient Instructions  I will check a muscle blood test, but for now - stop the Lipitor, and take Alleve over the counter if needed. Rest as needed but range of motion/keep leg moving throughout the day. If not improving in next few days, or worsening sooner - return for recheck.   As your pain resolves, can try back on the Lipitor every other day.  If you tolerate this dosing for 2 weeks, then increase back to daily.  If pain returns on Lipitor - return to discuss other treatment.  Return to the clinic or go to the nearest emergency room if any of your symptoms worsen or new symptoms occur.    I personally performed the services described in this documentation, which was scribed in my presence. The recorded information has been reviewed and considered, and addended by me as needed.

## 2015-08-23 NOTE — Patient Instructions (Signed)
I will check a muscle blood test, but for now - stop the Lipitor, and take Alleve over the counter if needed. Rest as needed but range of motion/keep leg moving throughout the day. If not improving in next few days, or worsening sooner - return for recheck.   As your pain resolves, can try back on the Lipitor every other day.  If you tolerate this dosing for 2 weeks, then increase back to daily.  If pain returns on Lipitor - return to discuss other treatment.  Return to the clinic or go to the nearest emergency room if any of your symptoms worsen or new symptoms occur.

## 2015-08-28 ENCOUNTER — Ambulatory Visit (INDEPENDENT_AMBULATORY_CARE_PROVIDER_SITE_OTHER): Payer: Federal, State, Local not specified - PPO

## 2015-08-28 ENCOUNTER — Ambulatory Visit (INDEPENDENT_AMBULATORY_CARE_PROVIDER_SITE_OTHER): Payer: Federal, State, Local not specified - PPO | Admitting: Family Medicine

## 2015-08-28 VITALS — BP 140/100 | HR 69 | Temp 98.9°F | Resp 16 | Ht 66.0 in | Wt 167.0 lb

## 2015-08-28 DIAGNOSIS — M25552 Pain in left hip: Secondary | ICD-10-CM

## 2015-08-28 DIAGNOSIS — I10 Essential (primary) hypertension: Secondary | ICD-10-CM

## 2015-08-28 MED ORDER — METHYLPREDNISOLONE ACETATE 80 MG/ML IJ SUSP
40.0000 mg | Freq: Once | INTRAMUSCULAR | Status: AC
  Administered 2015-08-28: 40 mg via INTRAMUSCULAR

## 2015-08-28 MED ORDER — TRAMADOL HCL 50 MG PO TABS: 50.0000 mg | ORAL_TABLET | Freq: Three times a day (TID) | ORAL | Status: DC | PRN

## 2015-08-28 MED ORDER — METOPROLOL SUCCINATE ER 25 MG PO TB24: 25.0000 mg | ORAL_TABLET | Freq: Every day | ORAL | Status: AC

## 2015-08-28 NOTE — Patient Instructions (Signed)
I refilled your metoprolol for you to take for your blood pressure Use the tramadol as needed for hip pain- remember this can make you a bit sleepy!  Do not drive if feeling sleepy Also ok to use tylenol as needed You will be out of work until Friday- if you are not able to return then please let me know Please keep me posted regarding your hip pain

## 2015-08-28 NOTE — Progress Notes (Signed)
[facility] [address] [place] [address] [phone]  Date:  08/28/2015   Name:  Pamela Summers   DOB:  Jun 22, 1951   MRN:  [id number]  PCP:  Shirline Frees, MD    Chief Complaint: Follow-up   History of Present Illness:  Pamela Summers is a 65 y.o. very pleasant female patient who presents with the following:  She was seen here on 12/29 for left thigh pain of unknown origin.  We checked a CK which was slightly high at 225- stopped her statin. LFTs were normal.  However her pain has not gotten better and now seems to be more over the lateral left hip In the course of her job she stands a lot.  She had been working prior to being seen here at her last visit. No known injury or changer in her routine.  She is taking aleve but it does not seem to help Getting out of a chair, out of the car, etc is very painful The problem is only in her left hip- no other acute joint issues She did stop taking lipitor in the past doe to leg aches.  Her cholesterol is managed by her PCP at [facility]  Patient Active Problem List   Diagnosis Date Noted  . Essential hypertension 12/26/2014  . Abnormal thyroid blood test 03/30/2014  . Breast cancer of upper-inner quadrant of right female breast ([place]) 07/18/2013  . Abnormal CXR 05/19/2012    Past Medical History  Diagnosis Date  . Arthritis   . Heart murmur   . Cancer ([place])   . Hearing loss   . Breast cancer ([place]) 1989, 2014  . Hypertension   . Complication of anesthesia     pt states"difficult to wake up"  . PONV (postoperative nausea and vomiting)     "scop patch placed and does not work"    Past Surgical History  Procedure Laterality Date  . Abdominal hysterectomy      partical  . Breast surgery  1990    lumpectomy - right  . Ganglion cyst excision  2002 - approximate  . Stapedes surgery Right 2011  . Total mastectomy Right 04/14/2013    Procedure: TOTAL MASTECTOMY;  Surgeon: Haywood Lasso, MD;  Location: [facility] OR;   Service: General;  Laterality: Right;    Social History  Substance Use Topics  . Smoking status: Never Smoker   . Smokeless tobacco: Never Used  . Alcohol Use: No    Family History  Problem Relation Age of Onset  . Cancer Mother     uterine or ovarian cancer  . Heart disease Father   . Cancer Sister     maternal half sister with uterine or cervical cancer; died in her 51s  . Cancer Maternal Uncle     unknown cancer  . Cancer Cousin     3 maternal cousins with unknown cancers    No Known Allergies  Medication list has been reviewed and updated.  Current Outpatient Prescriptions on File Prior to Visit  Medication Sig Dispense Refill  . atorvastatin (LIPITOR) 20 MG tablet Take 20 mg by mouth daily.    . cholecalciferol (VITAMIN D) 1000 UNITS tablet Take 1,000 Units by mouth daily.    . hydrochlorothiazide (MICROZIDE) 12.5 MG capsule Take 12.5 mg by mouth daily. Reported on 08/23/2015    . metoprolol succinate (TOPROL-XL) 25 MG 24 hr tablet Take 25 mg by mouth daily.     No current facility-administered  medications on file prior to visit.    Review of Systems:  As per HPI- otherwise negative.   Physical Examination: Filed Vitals:   08/28/15 0901  BP: 140/100  Pulse: 69  Temp: 98.9 F (37.2 C)  Resp: 16   Filed Vitals:   08/28/15 0901  Height: 5\' 6"  (1.676 m)  Weight: 167 lb (75.751 kg)   Body mass index is 26.97 kg/(m^2). Ideal Body Weight: Weight in (lb) to have BMI = 25: 154.6  GEN: WDWN, NAD, Non-toxic, A & O x 3, overweight, looks well HEENT: Atraumatic, Normocephalic. Neck supple. No masses, No LAD. Ears and Nose: No external deformity. CV: RRR, No M/G/R. No JVD. No thrill. No extra heart sounds. PULM: CTA B, no wheezes, crackles, rhonchi. No retractions. No resp. distress. No accessory muscle use. ABD: S, NT, ND, +BS. No rebound. No HSM. EXTR: No c/c/e NEURO slow gait. Favoring left hip Left hip: she is tender over the lateral hip/ greater  trochanter.  No pain with hip flexion but she does have pain with hip abduction. Normal strength of the BLE  PSYCH: Normally interactive. Conversant. Not depressed or anxious appearing.  Calm demeanor.   [facility] reading (PRIMARY) by  Dr. Lorelei Pont Left hip: mild degenerative change  DG HIP (WITH OR WITHOUT PELVIS) 2-3V LEFT  COMPARISON: None.  FINDINGS: Frontal pelvis as well as lateral left hip images were obtained. There is slight symmetric narrowing of both hip joints. No fracture or dislocation. No erosive change. There are small phleboliths in the pelvis.  IMPRESSION: Slight symmetric narrowing of both hip joints. No fracture or dislocation.  VC obtained.  Prepped over left greater trochanter with betadine and alcohol. Ethyl chloride spray for pain control Injected a mixture of 65ml 1% lidocaine and 40mg  of depomedrol into the trochanteric bursa. Pt tolerated well.  No compliacations.  Applied band-aid  Assessment and Plan: Left hip pain - Plan: DG HIP UNILAT W OR W/O PELVIS 2-3 VIEWS LEFT, traMADol (ULTRAM) 50 MG tablet, methylPREDNISolone acetate (DEPO-MEDROL) injection 40 mg  Essential hypertension - Plan: metoprolol succinate (TOPROL-XL) 25 MG 24 hr tablet  Treat for likely greater trochanteric bursitis with a shot of lidocaine and depomedrol.  Tramadol to use as needed for hip pain- cautioned regarding sedation.   Gave her a note to be OOW for 3 days- she will let me know if not improved by the time she is to return  Refilled her BP medication  Signed Lamar Blinks, MD

## 2015-09-17 ENCOUNTER — Other Ambulatory Visit: Payer: Self-pay

## 2015-09-17 DIAGNOSIS — Z1231 Encounter for screening mammogram for malignant neoplasm of breast: Secondary | ICD-10-CM

## 2015-10-15 ENCOUNTER — Ambulatory Visit
Admission: RE | Admit: 2015-10-15 | Discharge: 2015-10-15 | Disposition: A | Discharge status: Home / Self Care | Payer: Federal, State, Local not specified - PPO | Source: Ambulatory Visit

## 2015-10-15 DIAGNOSIS — Z1231 Encounter for screening mammogram for malignant neoplasm of breast: Secondary | ICD-10-CM

## 2015-10-18 ENCOUNTER — Other Ambulatory Visit: Payer: Self-pay | Admitting: General Surgery

## 2015-10-18 DIAGNOSIS — R928 Other abnormal and inconclusive findings on diagnostic imaging of breast: Secondary | ICD-10-CM

## 2015-10-24 ENCOUNTER — Other Ambulatory Visit: Payer: Federal, State, Local not specified - PPO

## 2015-10-29 ENCOUNTER — Ambulatory Visit
Admission: RE | Admit: 2015-10-29 | Discharge: 2015-10-29 | Disposition: A | Discharge status: Home / Self Care | Payer: Federal, State, Local not specified - PPO | Source: Ambulatory Visit | Attending: General Surgery | Admitting: General Surgery

## 2015-10-29 DIAGNOSIS — R928 Other abnormal and inconclusive findings on diagnostic imaging of breast: Secondary | ICD-10-CM

## 2015-11-09 ENCOUNTER — Other Ambulatory Visit: Payer: Self-pay | Admitting: *Deleted

## 2015-11-09 DIAGNOSIS — C50211 Malignant neoplasm of upper-inner quadrant of right female breast: Secondary | ICD-10-CM

## 2015-11-12 ENCOUNTER — Ambulatory Visit (HOSPITAL_BASED_OUTPATIENT_CLINIC_OR_DEPARTMENT_OTHER): Payer: Federal, State, Local not specified - PPO | Admitting: Nurse Practitioner

## 2015-11-12 ENCOUNTER — Other Ambulatory Visit (HOSPITAL_BASED_OUTPATIENT_CLINIC_OR_DEPARTMENT_OTHER): Payer: Federal, State, Local not specified - PPO

## 2015-11-12 ENCOUNTER — Telehealth: Payer: Self-pay | Admitting: Nurse Practitioner

## 2015-11-12 ENCOUNTER — Encounter: Payer: Self-pay | Admitting: Nurse Practitioner

## 2015-11-12 VITALS — BP 152/86 | HR 62 | Temp 97.9°F | Resp 18 | Ht 66.0 in | Wt 163.4 lb

## 2015-11-12 DIAGNOSIS — Z853 Personal history of malignant neoplasm of breast: Secondary | ICD-10-CM

## 2015-11-12 DIAGNOSIS — C50211 Malignant neoplasm of upper-inner quadrant of right female breast: Secondary | ICD-10-CM

## 2015-11-12 LAB — CBC WITH DIFFERENTIAL/PLATELET
BASO%: 0.4 % (ref 0.0–2.0)
BASOS ABS: 0 10*3/uL (ref 0.0–0.1)
EOS%: 2.8 % (ref 0.0–7.0)
Eosinophils Absolute: 0.2 10*3/uL (ref 0.0–0.5)
HCT: 41.7 % (ref 34.8–46.6)
HGB: 13.5 g/dL (ref 11.6–15.9)
LYMPH%: 40.8 % (ref 14.0–49.7)
MCH: 26 pg (ref 25.1–34.0)
MCHC: 32.4 g/dL (ref 31.5–36.0)
MCV: 80.3 fL (ref 79.5–101.0)
MONO#: 0.4 10*3/uL (ref 0.1–0.9)
MONO%: 4.7 % (ref 0.0–14.0)
NEUT#: 4.3 10*3/uL (ref 1.5–6.5)
NEUT%: 51.3 % (ref 38.4–76.8)
Platelets: 306 10*3/uL (ref 145–400)
RBC: 5.19 10*6/uL (ref 3.70–5.45)
RDW: 15.2 % — ABNORMAL HIGH (ref 11.2–14.5)
WBC: 8.5 10*3/uL (ref 3.9–10.3)
lymph#: 3.5 10*3/uL — ABNORMAL HIGH (ref 0.9–3.3)

## 2015-11-12 LAB — COMPREHENSIVE METABOLIC PANEL
ALT: 16 U/L (ref 0–55)
AST: 17 U/L (ref 5–34)
Albumin: 3.8 g/dL (ref 3.5–5.0)
Alkaline Phosphatase: 76 U/L (ref 40–150)
Anion Gap: 8 mEq/L (ref 3–11)
BUN: 12.9 mg/dL (ref 7.0–26.0)
CHLORIDE: 109 meq/L (ref 98–109)
CO2: 25 meq/L (ref 22–29)
CREATININE: 0.8 mg/dL (ref 0.6–1.1)
Calcium: 9.3 mg/dL (ref 8.4–10.4)
EGFR: 85 mL/min/{1.73_m2} — ABNORMAL LOW (ref >90)
Glucose: 99 mg/dl (ref 70–140)
POTASSIUM: 3.8 meq/L (ref 3.5–5.1)
Sodium: 142 mEq/L (ref 136–145)
Total Bilirubin: 0.35 mg/dL (ref 0.20–1.20)
Total Protein: 7.7 g/dL (ref 6.4–8.3)

## 2015-11-12 NOTE — Telephone Encounter (Signed)
appt made and avs printed °

## 2015-11-12 NOTE — Progress Notes (Signed)
ID: [name] OB: 1951/05/03  MR#: [id number]  ZES#:[id number]  PCP: Shirline Frees, MD GYN:  Delila Pereyra SU: [facility]] OTHER MD: Ulyess Blossom,  Merry Proud [facility]   CHIEF COMPLAINT: Hx of right breast cancer, status post right mastectomy  CURRENT TREATMENT: none, observation  BREAST CANCER HISTORY: The patient has a history of right upper outer quadrant breast cancer status post lumpectomy and axillary lymph node dissection in 1989, when the patient was 16 years old. She "met with a chemo doctor" but did not receive adjuvant chemotherapy or antiestrogens. She underwent adjuvant radiation under Dr Arloa Koh. I do not have those records.  On 09/22/2011 bilateral screening mammography at the breast Center showed a possible mass in the left breast additional studies felt this to have been a summation shadow where there was no palpable mass and ultrasound showed only normal tissue. Nevertheless left diagnostic mammography in 6 months was recommended and that was performed in August of 2013. This was again negative.  On 09/27/2012 the patient had bilateral screening mammography showing a possible mass in the right breast. Right diagnostic mammography and right breast ultrasound on 10/04/2012 showed a 7 mm ovoid density which by ultrasound measured 5 mm, at the 2:00 position in the right breast. The right axilla was unremarkable.  Ultrasound-guided biopsy of this mass to 06/13/2013 showed ([id number]) a ductal carcinoma in situ, grade 2 or 3, estrogen receptors 13% positive, with weak staining intensity, and progesterone receptor 3% positive, with weak staining intensity.  On 10/10/2012 she underwent bilateral breast MRI which showed a 2.4 cm area of clumped linear enhancement surrounding the recent biopsy clip there were no other suspicious areas in the right breast, and no findings of concern in the left breast or regional lymph node area.  With this information and after  appropriate discussion the patient proceeded to right simple mastectomy 04/14/2013. The final pathology from that procedure ([id number]) showed an area of 2.3 cm of ductal carcinoma in situ, high-grade. Margins were ample (1.2 cm from the deep margin was the closest).  The patient's subsequent history is as detailed below  INTERVAL HISTORY: Jaxson returns today for follow up of her breast cancer. The interval history is remarkable for bursitis to the left hip. She was given a shot of lidocaine and depomerol. She only used tramadol once for pain after this procedure. She is moving well now. Her blood pressure is a little high. She takes metoprolol and HCTZ daily. She plans to follow up with Dr. Kenton Kingfisher, her PCP.  REVIEW OF SYSTEMS: A detailed review of systems was otherwise entirely negative, except where noted above.   PAST MEDICAL HISTORY: Past Medical History  Diagnosis Date  . Arthritis   . Heart murmur   . Cancer ([place])   . Hearing loss   . Breast cancer ([place]) 1989, 2014  . Hypertension   . Complication of anesthesia     pt states"difficult to wake up"  . PONV (postoperative nausea and vomiting)     "scop patch placed and does not work"    PAST SURGICAL HISTORY: Past Surgical History  Procedure Laterality Date  . Abdominal hysterectomy      partical  . Breast surgery  1990    lumpectomy - right  . Ganglion cyst excision  2002 - approximate  . Stapedes surgery Right 2011  . Total mastectomy Right 04/14/2013    Procedure: TOTAL MASTECTOMY;  Surgeon: Haywood Lasso, MD;  Location: [facility];  Service:  General;  Laterality: Right;    FAMILY HISTORY Family History  Problem Relation Age of Onset  . Cancer Mother     uterine or ovarian cancer  . Heart disease Father   . Cancer Sister     maternal half sister with uterine or cervical cancer; died in her 28s  . Cancer Maternal Uncle     unknown cancer  . Cancer Cousin     3 maternal cousins with unknown cancers   the family  history is detailed extensively in the genetics consult in brief: The patient has very little information about her father. Her mother died before the age of 49 from either uterine or cervical cancer. The patient had 6 maternal half siblings and 4 paternal half siblings. There is no history of breast or ovarian cancer in the family as far as she can tell  GYNECOLOGIC HISTORY:  Menarche age 61, first live birth age 30, the patient is GX P1. She status post simple hysterectomy, without salpingo-oophorectomy. She did not use hormone replacement.  SOCIAL HISTORY:   Albertina works as a Company secretary, third shift, for the [employer]. She lives by herself, with no pets. Her daughter Florencia Zaccaro lives in [place] and works in a Nurse, learning disability. The patient has 2 grandchildren. She attends a local [employer] DIRECTIVES:  in place. The patient's daughter is her healthcare power of attorney. Hassan Rowan can be reached at [place]: Social History  Substance Use Topics  . Smoking status: Never Smoker   . Smokeless tobacco: Never Used  . Alcohol Use: No     Colonoscopy: 2010?/ [facility]  PAP: Status post hysterectomy   Bone density: At Dr.Mezer's/ "normal" and he  Lipid panel:  No Known Allergies  Current Outpatient Prescriptions  Medication Sig Dispense Refill  . atorvastatin (LIPITOR) 20 MG tablet Take 20 mg by mouth daily.    . cholecalciferol (VITAMIN D) 1000 UNITS tablet Take 1,000 Units by mouth daily.    . hydrochlorothiazide (MICROZIDE) 12.5 MG capsule Take 12.5 mg by mouth daily. Reported on 08/23/2015    . metoprolol succinate (TOPROL-XL) 25 MG 24 hr tablet Take 1 tablet (25 mg total) by mouth daily. 90 tablet 3  . traMADol (ULTRAM) 50 MG tablet Take 1 tablet (50 mg total) by mouth every 8 (eight) hours as needed. 30 tablet 0   No current facility-administered medications for this visit.    OBJECTIVE: Middle-aged [place] American woman  in no acute distress BP  Filed Vitals:   11/12/15 0954  BP: 152/86  Pulse: 62  Temp: 97.9 F (36.6 C)  Resp: 18     Body mass index is 26.39 kg/(m^2).    ECOG FS:0 - Asymptomatic  Skin: warm, dry  HEENT: sclerae anicteric, conjunctivae pink, oropharynx clear. No thrush or mucositis.  Lymph Nodes: No cervical or supraclavicular lymphadenopathy  Lungs: clear to auscultation bilaterally, no rales, wheezes, or rhonci  Heart: regular rate and rhythm  Abdomen: round, soft, non tender, positive bowel sounds  Musculoskeletal: No focal spinal tenderness, no peripheral edema  Neuro: non focal, well oriented, positive affect  Breasts: right breast status post mastectomy and radiation. No evidence of recurrent disease. Bilateral axillae benign. Left breast unremarkable.   LAB RESULTS:  CMP     Component Value Date/Time   NA 142 11/12/2015 0926   NA 133* 03/13/2014 1437   K 3.8 11/12/2015 0926   K 3.9 03/13/2014 1437  CL 95* 03/13/2014 1437   CO2 25 11/12/2015 0926   CO2 25 03/13/2014 1437   GLUCOSE 99 11/12/2015 0926   GLUCOSE 106* 03/13/2014 1437   BUN 12.9 11/12/2015 0926   BUN 14 03/13/2014 1437   CREATININE 0.8 11/12/2015 0926   CREATININE 0.60 03/13/2014 1437   CREATININE 0.75 03/08/2014 2131   CALCIUM 9.3 11/12/2015 0926   CALCIUM 9.0 03/13/2014 1437   PROT 7.7 11/12/2015 0926   PROT 8.0 03/13/2014 1437   ALBUMIN 3.8 11/12/2015 0926   ALBUMIN 3.7 03/13/2014 1437   AST 17 11/12/2015 0926   AST 33 03/13/2014 1437   ALT 16 11/12/2015 0926   ALT 35 03/13/2014 1437   ALKPHOS 76 11/12/2015 0926   ALKPHOS 81 03/13/2014 1437   BILITOT 0.35 11/12/2015 0926   BILITOT 0.5 03/13/2014 1437   GFRNONAA >89 03/13/2014 1437   GFRNONAA 88* 03/08/2014 2131   GFRAA >89 03/13/2014 1437   GFRAA >90 03/08/2014 2131    I No results found for: SPEP  Lab Results  Component Value Date   WBC 8.5 11/12/2015   NEUTROABS 4.3 11/12/2015   HGB 13.5 11/12/2015   HCT 41.7 11/12/2015   MCV  80.3 11/12/2015   PLT 306 11/12/2015      Chemistry      Component Value Date/Time   NA 142 11/12/2015 0926   NA 133* 03/13/2014 1437   K 3.8 11/12/2015 0926   K 3.9 03/13/2014 1437   CL 95* 03/13/2014 1437   CO2 25 11/12/2015 0926   CO2 25 03/13/2014 1437   BUN 12.9 11/12/2015 0926   BUN 14 03/13/2014 1437   CREATININE 0.8 11/12/2015 0926   CREATININE 0.60 03/13/2014 1437   CREATININE 0.75 03/08/2014 2131      Component Value Date/Time   CALCIUM 9.3 11/12/2015 0926   CALCIUM 9.0 03/13/2014 1437   ALKPHOS 76 11/12/2015 0926   ALKPHOS 81 03/13/2014 1437   AST 17 11/12/2015 0926   AST 33 03/13/2014 1437   ALT 16 11/12/2015 0926   ALT 35 03/13/2014 1437   BILITOT 0.35 11/12/2015 0926   BILITOT 0.5 03/13/2014 1437       No results found for: LABCA2  No components found for: TMAUQ333  No results for input(s): INR in the last 168 hours.  Urinalysis    Component Value Date/Time   COLORURINE AMBER* 03/08/2014 2350   APPEARANCEUR CLOUDY* 03/08/2014 2350   LABSPEC 1.031* 03/08/2014 2350   PHURINE 6.0 03/08/2014 2350   GLUCOSEU NEGATIVE 03/08/2014 2350   HGBUR MODERATE* 03/08/2014 2350   BILIRUBINUR negative 03/24/2014 1303   BILIRUBINUR NEGATIVE 03/08/2014 2350   KETONESUR 40* 03/08/2014 2350   PROTEINUR 30 03/24/2014 1303   PROTEINUR >300* 03/08/2014 2350   UROBILINOGEN 0.2 03/24/2014 1303   UROBILINOGEN 0.2 03/08/2014 2350   NITRITE negative 03/24/2014 1303   NITRITE NEGATIVE 03/08/2014 2350   LEUKOCYTESUR Trace 03/24/2014 1303    STUDIES: EXAM: DIGITAL DIAGNOSTIC LEFT MAMMOGRAM WITH 3D TOMOSYNTHESIS WITH CAD  ULTRASOUND LEFT BREAST  COMPARISON: Previous exam(s).  ACR Breast Density Category b: There are scattered areas of fibroglandular density.  FINDINGS: Additional views of the left breast demonstrate an oval, circumscribed lesion to be present medially located within the left breast at approximately the 9:30 o'clock  position.  Mammographic images were processed with CAD.  On physical exam, there is no discrete palpable abnormality within the medial left breast.  Targeted ultrasound is performed, showing a simple cyst measuring 7 mm size located within  the left breast at the 9:30 o'clock position 5 cm from the nipple corresponding to mammographic finding. There are no additional findings.  IMPRESSION: 7 mm simple cyst located within the left breast at the 9:30 o'clock position 5 cm from the nipple corresponding to the mammographic finding.  RECOMMENDATION: Screening of left breast mammography in 1 year.  I have discussed the findings and recommendations with the patient. Results were also provided in writing at the conclusion of the visit. If applicable, a reminder letter will be sent to the patient regarding the next appointment.  BI-RADS CATEGORY 2: Benign.   Electronically Signed  By: Altamese Cabal M.D.  On: 10/29/2015 11:52  ASSESSMENT: 65 y.o. BRCA negative [place] woman  (1) status post right lumpectomy and axillary lymph node dissection in 1989 for an upper outer quadrant invasive carcinoma, status post radiation, no chemotherapy or antiestrogen therapy given  (2) status post right simple mastectomy 04/14/2013  for in upper inner quadrant 2.3 cm ductal carcinoma in situ, high-grade, estrogen receptor 13% and progesterone receptors 3% positive, both weakly staining, with ample margins  (3) the patient opted against reconstruction  PLAN: [place] continues to do well as far as her breast cancer is concerned. She is now 2.5 years out from her right mastectomy, which essentially cured the DCIS that was found. There is no evidence of any new cancer. I explained to her that the cyst found in March was not malignant, and too small to require action.   Shequita will continue mammograms yearly, and agreed to observation visits yearly as well. She has been encouraged to call  with any issues that might arise before her next visit here.  Laurie Panda, NP   11/12/2015 10:30 AM

## 2015-11-14 ENCOUNTER — Ambulatory Visit: Payer: Federal, State, Local not specified - PPO | Admitting: Nurse Practitioner

## 2015-11-14 ENCOUNTER — Other Ambulatory Visit: Payer: Federal, State, Local not specified - PPO

## 2016-02-18 DIAGNOSIS — H906 Mixed conductive and sensorineural hearing loss, bilateral: Secondary | ICD-10-CM | POA: Diagnosis not present

## 2016-02-18 DIAGNOSIS — H8001 Otosclerosis involving oval window, nonobliterative, right ear: Secondary | ICD-10-CM | POA: Diagnosis not present

## 2016-02-19 ENCOUNTER — Other Ambulatory Visit: Payer: Self-pay | Admitting: Otolaryngology

## 2016-02-19 DIAGNOSIS — H906 Mixed conductive and sensorineural hearing loss, bilateral: Secondary | ICD-10-CM

## 2016-03-03 ENCOUNTER — Ambulatory Visit
Admission: RE | Admit: 2016-03-03 | Discharge: 2016-03-03 | Disposition: A | Discharge status: Home / Self Care | Payer: Federal, State, Local not specified - PPO | Source: Ambulatory Visit | Attending: Otolaryngology | Admitting: Otolaryngology

## 2016-03-03 DIAGNOSIS — H9042 Sensorineural hearing loss, unilateral, left ear, with unrestricted hearing on the contralateral side: Secondary | ICD-10-CM | POA: Diagnosis not present

## 2016-03-03 DIAGNOSIS — H906 Mixed conductive and sensorineural hearing loss, bilateral: Secondary | ICD-10-CM

## 2016-03-03 MED ORDER — IOPAMIDOL (ISOVUE-300) INJECTION 61%: 75.0000 mL | Freq: Once | INTRAVENOUS | Status: DC | PRN

## 2016-03-17 DIAGNOSIS — Z6826 Body mass index (BMI) 26.0-26.9, adult: Secondary | ICD-10-CM | POA: Diagnosis not present

## 2016-03-17 DIAGNOSIS — Z01419 Encounter for gynecological examination (general) (routine) without abnormal findings: Secondary | ICD-10-CM | POA: Diagnosis not present

## 2016-03-26 DIAGNOSIS — H8001 Otosclerosis involving oval window, nonobliterative, right ear: Secondary | ICD-10-CM | POA: Diagnosis not present

## 2016-03-26 DIAGNOSIS — H748X2 Other specified disorders of left middle ear and mastoid: Secondary | ICD-10-CM | POA: Diagnosis not present

## 2016-03-26 DIAGNOSIS — H906 Mixed conductive and sensorineural hearing loss, bilateral: Secondary | ICD-10-CM | POA: Diagnosis not present

## 2016-04-03 DIAGNOSIS — I10 Essential (primary) hypertension: Secondary | ICD-10-CM | POA: Diagnosis not present

## 2016-04-03 DIAGNOSIS — E785 Hyperlipidemia, unspecified: Secondary | ICD-10-CM | POA: Diagnosis not present

## 2016-04-03 DIAGNOSIS — H9190 Unspecified hearing loss, unspecified ear: Secondary | ICD-10-CM | POA: Diagnosis not present

## 2016-04-03 DIAGNOSIS — Z01818 Encounter for other preprocedural examination: Secondary | ICD-10-CM | POA: Diagnosis not present

## 2016-04-07 ENCOUNTER — Other Ambulatory Visit: Payer: Self-pay | Admitting: Family Medicine

## 2016-04-07 ENCOUNTER — Ambulatory Visit
Admission: RE | Admit: 2016-04-07 | Discharge: 2016-04-07 | Disposition: A | Discharge status: Home / Self Care | Payer: Federal, State, Local not specified - PPO | Source: Ambulatory Visit | Attending: Family Medicine | Admitting: Family Medicine

## 2016-04-07 DIAGNOSIS — Z01818 Encounter for other preprocedural examination: Secondary | ICD-10-CM | POA: Diagnosis not present

## 2016-05-01 DIAGNOSIS — H7012 Chronic mastoiditis, left ear: Secondary | ICD-10-CM | POA: Diagnosis not present

## 2016-05-01 DIAGNOSIS — H906 Mixed conductive and sensorineural hearing loss, bilateral: Secondary | ICD-10-CM | POA: Diagnosis not present

## 2016-05-01 DIAGNOSIS — H8001 Otosclerosis involving oval window, nonobliterative, right ear: Secondary | ICD-10-CM | POA: Diagnosis not present

## 2016-05-01 DIAGNOSIS — H748X2 Other specified disorders of left middle ear and mastoid: Secondary | ICD-10-CM | POA: Diagnosis not present

## 2016-05-12 ENCOUNTER — Other Ambulatory Visit: Payer: Self-pay | Admitting: Otolaryngology

## 2016-05-12 DIAGNOSIS — H748X2 Other specified disorders of left middle ear and mastoid: Secondary | ICD-10-CM | POA: Diagnosis not present

## 2016-05-12 DIAGNOSIS — H908 Mixed conductive and sensorineural hearing loss, unspecified: Secondary | ICD-10-CM | POA: Diagnosis not present

## 2016-05-12 DIAGNOSIS — H8001 Otosclerosis involving oval window, nonobliterative, right ear: Secondary | ICD-10-CM | POA: Diagnosis not present

## 2016-05-12 DIAGNOSIS — H906 Mixed conductive and sensorineural hearing loss, bilateral: Secondary | ICD-10-CM | POA: Diagnosis not present

## 2016-05-12 DIAGNOSIS — H7012 Chronic mastoiditis, left ear: Secondary | ICD-10-CM | POA: Diagnosis not present

## 2016-06-09 DIAGNOSIS — H906 Mixed conductive and sensorineural hearing loss, bilateral: Secondary | ICD-10-CM | POA: Diagnosis not present

## 2016-06-09 DIAGNOSIS — H748X2 Other specified disorders of left middle ear and mastoid: Secondary | ICD-10-CM | POA: Diagnosis not present

## 2016-06-09 DIAGNOSIS — H8001 Otosclerosis involving oval window, nonobliterative, right ear: Secondary | ICD-10-CM | POA: Diagnosis not present

## 2016-07-15 DIAGNOSIS — H25813 Combined forms of age-related cataract, bilateral: Secondary | ICD-10-CM | POA: Diagnosis not present

## 2016-07-15 DIAGNOSIS — H40013 Open angle with borderline findings, low risk, bilateral: Secondary | ICD-10-CM | POA: Diagnosis not present

## 2016-07-15 DIAGNOSIS — H04123 Dry eye syndrome of bilateral lacrimal glands: Secondary | ICD-10-CM | POA: Diagnosis not present

## 2016-07-15 DIAGNOSIS — H10413 Chronic giant papillary conjunctivitis, bilateral: Secondary | ICD-10-CM | POA: Diagnosis not present

## 2016-07-21 DIAGNOSIS — Z0000 Encounter for general adult medical examination without abnormal findings: Secondary | ICD-10-CM | POA: Diagnosis not present

## 2016-07-21 DIAGNOSIS — E7800 Pure hypercholesterolemia, unspecified: Secondary | ICD-10-CM | POA: Diagnosis not present

## 2016-07-21 DIAGNOSIS — I10 Essential (primary) hypertension: Secondary | ICD-10-CM | POA: Diagnosis not present

## 2016-09-01 DIAGNOSIS — H906 Mixed conductive and sensorineural hearing loss, bilateral: Secondary | ICD-10-CM | POA: Diagnosis not present

## 2016-09-01 DIAGNOSIS — H8001 Otosclerosis involving oval window, nonobliterative, right ear: Secondary | ICD-10-CM | POA: Diagnosis not present

## 2016-09-01 DIAGNOSIS — H748X2 Other specified disorders of left middle ear and mastoid: Secondary | ICD-10-CM | POA: Diagnosis not present

## 2016-09-18 ENCOUNTER — Ambulatory Visit (INDEPENDENT_AMBULATORY_CARE_PROVIDER_SITE_OTHER): Payer: Federal, State, Local not specified - PPO | Admitting: Emergency Medicine

## 2016-09-18 VITALS — BP 138/78 | HR 63 | Temp 98.7°F | Resp 16 | Ht 66.0 in | Wt 162.0 lb

## 2016-09-18 DIAGNOSIS — M545 Low back pain, unspecified: Secondary | ICD-10-CM

## 2016-09-18 DIAGNOSIS — M25551 Pain in right hip: Secondary | ICD-10-CM

## 2016-09-18 MED ORDER — METHYLPREDNISOLONE ACETATE 80 MG/ML IJ SUSP
80.0000 mg | Freq: Once | INTRAMUSCULAR | Status: AC
Start: 2016-09-18 — End: 2016-09-18
  Administered 2016-09-18: 80 mg via INTRAMUSCULAR

## 2016-09-18 MED ORDER — TRAMADOL HCL 50 MG PO TABS: 50.0000 mg | ORAL_TABLET | Freq: Three times a day (TID) | ORAL | 0 refills | Status: AC | PRN

## 2016-09-18 NOTE — Patient Instructions (Signed)
Back Pain, Adult Introduction Back pain is very common. The pain often gets better over time. The cause of back pain is usually not dangerous. Most people can learn to manage their back pain on their own. Follow these instructions at home: Watch your back pain for any changes. The following actions may help to lessen any pain you are feeling:  Stay active. Start with short walks on flat ground if you can. Try to walk farther each day.  Exercise regularly as told by your doctor. Exercise helps your back heal faster. It also helps avoid future injury by keeping your muscles strong and flexible.  Do not sit, drive, or stand in one place for more than 30 minutes.  Do not stay in bed. Resting more than 1-2 days can slow down your recovery.  Be careful when you bend or lift an object. Use good form when lifting:  Bend at your knees.  Keep the object close to your body.  Do not twist.  Sleep on a firm mattress. Lie on your side, and bend your knees. If you lie on your back, put a pillow under your knees.  Take medicines only as told by your doctor.  Put ice on the injured area.  Put ice in a plastic bag.  Place a towel between your skin and the bag.  Leave the ice on for 20 minutes, 2-3 times a day for the first 2-3 days. After that, you can switch between ice and heat packs.  Avoid feeling anxious or stressed. Find good ways to deal with stress, such as exercise.  Maintain a healthy weight. Extra weight puts stress on your back. Contact a doctor if:  You have pain that does not go away with rest or medicine.  You have worsening pain that goes down into your legs or buttocks.  You have pain that does not get better in one week.  You have pain at night.  You lose weight.  You have a fever or chills. Get help right away if:  You cannot control when you poop (bowel movement) or pee (urinate).  Your arms or legs feel weak.  Your arms or legs lose feeling  (numbness).  You feel sick to your stomach (nauseous) or throw up (vomit).  You have belly (abdominal) pain.  You feel like you may pass out (faint). This information is not intended to replace advice given to you by your health care provider. Make sure you discuss any questions you have with your health care provider. Document Released: 01/28/2008 Document Revised: 01/17/2016 Document Reviewed: 12/13/2013  2017 Elsevier  

## 2016-09-18 NOTE — Progress Notes (Signed)
Pamela Summers 66 y.o.   Chief Complaint  Patient presents with  . Back Pain    right lower    HISTORY OF PRESENT ILLNESS: This is a 66 y.o. female was raking last Tuesday and turned the wrong way c/o pain to right lower back/right hip since; hurts to move.  Back Pain  This is a new problem. The current episode started in the past 7 days. The problem occurs constantly. The problem is unchanged. The pain is present in the lumbar spine. The quality of the pain is described as aching. The pain does not radiate. The pain is at a severity of 5/10. The pain is moderate. The pain is the same all the time. The symptoms are aggravated by bending, position and twisting. Stiffness is present all day. Pertinent negatives include no abdominal pain, bladder incontinence, bowel incontinence, chest pain, dysuria, fever, headaches, leg pain, numbness, paresis, paresthesias, pelvic pain, tingling, weakness or weight loss. Risk factors include history of cancer. She has tried analgesics for the symptoms. The treatment provided no relief.     Prior to Admission medications   Medication Sig Start Date End Date Taking? Authorizing Provider  atorvastatin (LIPITOR) 20 MG tablet Take 20 mg by mouth daily.   Yes Historical Provider, MD  cholecalciferol (VITAMIN D) 1000 UNITS tablet Take 1,000 Units by mouth daily.   Yes Historical Provider, MD  hydrochlorothiazide (MICROZIDE) 12.5 MG capsule Take 12.5 mg by mouth daily. Reported on 08/23/2015   Yes Historical Provider, MD  metoprolol succinate (TOPROL-XL) 25 MG 24 hr tablet Take 1 tablet (25 mg total) by mouth daily. 08/28/15  Yes Gay Filler Copland, MD  traMADol (ULTRAM) 50 MG tablet Take 1 tablet (50 mg total) by mouth every 8 (eight) hours as needed. 08/28/15  Yes Darreld Mclean, MD    No Known Allergies  Patient Active Problem List   Diagnosis Date Noted  . Essential hypertension 12/26/2014  . Abnormal thyroid blood test 03/30/2014  . Breast cancer of  upper-inner quadrant of right female breast ([place]) 07/18/2013  . Abnormal CXR 05/19/2012    Past Medical History:  Diagnosis Date  . Arthritis   . Breast cancer ([place]) 1989, 2014  . Cancer ([place])   . Complication of anesthesia    pt states"difficult to wake up"  . Hearing loss   . Heart murmur   . Hypertension   . PONV (postoperative nausea and vomiting)    "scop patch placed and does not work"    Past Surgical History:  Procedure Laterality Date  . ABDOMINAL HYSTERECTOMY     partical  . BREAST SURGERY  1990   lumpectomy - right  . GANGLION CYST EXCISION  2002 - approximate  . STAPEDES SURGERY Right 2011  . TOTAL MASTECTOMY Right 04/14/2013   Procedure: TOTAL MASTECTOMY;  Surgeon: Haywood Lasso, MD;  Location: [facility];  Service: General;  Laterality: Right;    Social History   Social History  . Marital status: Single    Spouse name: N/A  . Number of children: N/A  . Years of education: N/A   Occupational History  . Not on file.   Social History Main Topics  . Smoking status: Never Smoker  . Smokeless tobacco: Never Used  . Alcohol use No  . Drug use: No  . Sexual activity: No   Other Topics Concern  . Not on file   Social History Narrative  . No narrative on file    Family History  Problem Relation Age of Onset  . Cancer Mother     uterine or ovarian cancer  . Heart disease Father   . Cancer Sister     maternal half sister with uterine or cervical cancer; died in her 91s  . Cancer Maternal Uncle     unknown cancer  . Cancer Cousin     3 maternal cousins with unknown cancers     Review of Systems  Constitutional: Negative for fever and weight loss.  HENT: Negative.   Respiratory: Negative for cough and shortness of breath.   Cardiovascular: Negative for chest pain and palpitations.  Gastrointestinal: Negative for abdominal pain, bowel incontinence, diarrhea, nausea and vomiting.  Genitourinary: Negative for bladder incontinence, dysuria and  pelvic pain.  Musculoskeletal: Positive for back pain. Negative for neck pain.  Skin: Negative for rash.  Neurological: Negative for dizziness, tingling, sensory change, focal weakness, weakness, numbness, headaches and paresthesias.  Endo/Heme/Allergies: Negative.   Psychiatric/Behavioral: Negative.   All other systems reviewed and are negative.  Vitals:   09/18/16 1102  BP: 138/78  Pulse: 63  Resp: 16  Temp: 98.7 F (37.1 C)     Physical Exam  Constitutional: She is oriented to person, place, and time. She appears well-developed and well-nourished.  HENT:  Head: Normocephalic and atraumatic.  Mouth/Throat: Oropharynx is clear and moist. No oropharyngeal exudate.  Eyes: EOM are normal. Pupils are equal, round, and reactive to light.  Neck: Normal range of motion. Neck supple. No JVD present.  Cardiovascular: Normal rate, regular rhythm and normal heart sounds.   Pulmonary/Chest: Effort normal and breath sounds normal.  Abdominal: Soft. She exhibits no distension. There is no tenderness.  Musculoskeletal:       Lumbar back: She exhibits decreased range of motion, tenderness and spasm.       Back:  LE: NVI with FROM Right Hip: mild tenderness  Lymphadenopathy:    She has no cervical adenopathy.  Neurological: She is alert and oriented to person, place, and time. She displays normal reflexes. No sensory deficit. She exhibits normal muscle tone.  Skin: Skin is warm and dry. Capillary refill takes less than 2 seconds.  Psychiatric: She has a normal mood and affect. Her behavior is normal.  Vitals reviewed.    ASSESSMENT & PLAN: Pain management at this time. If no better in 1 week, needs to be seen again and given CA Hx may need additional workup.  Pamela Summers was seen today for back pain.  Diagnoses and all orders for this visit:  Acute right-sided low back pain without sciatica -     methylPREDNISolone acetate (DEPO-MEDROL) injection 80 mg; Inject 1 mL (80 mg total) into the  muscle once.  Right hip pain  Other orders -     Cancel: Urinalysis, dipstick only -     traMADol (ULTRAM) 50 MG tablet; Take 1 tablet (50 mg total) by mouth every 8 (eight) hours as needed.    Patient Instructions  Back Pain, Adult Introduction Back pain is very common. The pain often gets better over time. The cause of back pain is usually not dangerous. Most people can learn to manage their back pain on their own. Follow these instructions at home: Watch your back pain for any changes. The following actions may help to lessen any pain you are feeling:  Stay active. Start with short walks on flat ground if you can. Try to walk farther each day.  Exercise regularly as told by your doctor. Exercise helps your back  heal faster. It also helps avoid future injury by keeping your muscles strong and flexible.  Do not sit, drive, or stand in one place for more than 30 minutes.  Do not stay in bed. Resting more than 1-2 days can slow down your recovery.  Be careful when you bend or lift an object. Use good form when lifting:  Bend at your knees.  Keep the object close to your body.  Do not twist.  Sleep on a firm mattress. Lie on your side, and bend your knees. If you lie on your back, put a pillow under your knees.  Take medicines only as told by your doctor.  Put ice on the injured area.  Put ice in a plastic bag.  Place a towel between your skin and the bag.  Leave the ice on for 20 minutes, 2-3 times a day for the first 2-3 days. After that, you can switch between ice and heat packs.  Avoid feeling anxious or stressed. Find good ways to deal with stress, such as exercise.  Maintain a healthy weight. Extra weight puts stress on your back. Contact a doctor if:  You have pain that does not go away with rest or medicine.  You have worsening pain that goes down into your legs or buttocks.  You have pain that does not get better in one week.  You have pain at  night.  You lose weight.  You have a fever or chills. Get help right away if:  You cannot control when you poop (bowel movement) or pee (urinate).  Your arms or legs feel weak.  Your arms or legs lose feeling (numbness).  You feel sick to your stomach (nauseous) or throw up (vomit).  You have belly (abdominal) pain.  You feel like you may pass out (faint). This information is not intended to replace advice given to you by your health care provider. Make sure you discuss any questions you have with your health care provider. Document Released: 01/28/2008 Document Revised: 01/17/2016 Document Reviewed: 12/13/2013  2017 Elsevier     Agustina Caroli, MD [facility]

## 2016-09-29 ENCOUNTER — Other Ambulatory Visit: Payer: Self-pay | Admitting: General Surgery

## 2016-09-29 DIAGNOSIS — Z1231 Encounter for screening mammogram for malignant neoplasm of breast: Secondary | ICD-10-CM

## 2016-11-03 ENCOUNTER — Ambulatory Visit
Admission: RE | Admit: 2016-11-03 | Discharge: 2016-11-03 | Disposition: A | Discharge status: Home / Self Care | Payer: Federal, State, Local not specified - PPO | Source: Ambulatory Visit | Attending: General Surgery | Admitting: General Surgery

## 2016-11-03 DIAGNOSIS — Z1231 Encounter for screening mammogram for malignant neoplasm of breast: Secondary | ICD-10-CM

## 2016-11-07 ENCOUNTER — Other Ambulatory Visit: Payer: Self-pay | Admitting: *Deleted

## 2016-11-07 DIAGNOSIS — C50211 Malignant neoplasm of upper-inner quadrant of right female breast: Secondary | ICD-10-CM

## 2016-11-10 ENCOUNTER — Other Ambulatory Visit (HOSPITAL_BASED_OUTPATIENT_CLINIC_OR_DEPARTMENT_OTHER): Payer: Federal, State, Local not specified - PPO

## 2016-11-10 ENCOUNTER — Ambulatory Visit (HOSPITAL_BASED_OUTPATIENT_CLINIC_OR_DEPARTMENT_OTHER): Payer: Federal, State, Local not specified - PPO | Admitting: Oncology

## 2016-11-10 VITALS — BP 153/77 | HR 56 | Temp 97.8°F | Resp 18 | Ht 66.0 in | Wt 165.4 lb

## 2016-11-10 DIAGNOSIS — C50211 Malignant neoplasm of upper-inner quadrant of right female breast: Secondary | ICD-10-CM

## 2016-11-10 DIAGNOSIS — Z170 Estrogen receptor positive status [ER+]: Secondary | ICD-10-CM

## 2016-11-10 DIAGNOSIS — D0511 Intraductal carcinoma in situ of right breast: Secondary | ICD-10-CM

## 2016-11-10 LAB — COMPREHENSIVE METABOLIC PANEL
ALT: 17 U/L (ref 0–55)
AST: 16 U/L (ref 5–34)
Albumin: 3.7 g/dL (ref 3.5–5.0)
Alkaline Phosphatase: 79 U/L (ref 40–150)
Anion Gap: 11 mEq/L (ref 3–11)
BILIRUBIN TOTAL: 0.3 mg/dL (ref 0.20–1.20)
BUN: 10.9 mg/dL (ref 7.0–26.0)
CO2: 23 meq/L (ref 22–29)
CREATININE: 0.8 mg/dL (ref 0.6–1.1)
Calcium: 9.6 mg/dL (ref 8.4–10.4)
Chloride: 108 mEq/L (ref 98–109)
EGFR: >90 mL/min/{1.73_m2} (ref >90)
Glucose: 95 mg/dl (ref 70–140)
Potassium: 4 mEq/L (ref 3.5–5.1)
SODIUM: 142 meq/L (ref 136–145)
TOTAL PROTEIN: 7.5 g/dL (ref 6.4–8.3)

## 2016-11-10 LAB — CBC WITH DIFFERENTIAL/PLATELET
BASO%: 0.5 % (ref 0.0–2.0)
Basophils Absolute: 0 10*3/uL (ref 0.0–0.1)
EOS%: 2.4 % (ref 0.0–7.0)
Eosinophils Absolute: 0.2 10*3/uL (ref 0.0–0.5)
HCT: 38.5 % (ref 34.8–46.6)
HGB: 12.4 g/dL (ref 11.6–15.9)
LYMPH%: 37.7 % (ref 14.0–49.7)
MCH: 25.9 pg (ref 25.1–34.0)
MCHC: 32.2 g/dL (ref 31.5–36.0)
MCV: 80.4 fL (ref 79.5–101.0)
MONO#: 0.5 10*3/uL (ref 0.1–0.9)
MONO%: 6 % (ref 0.0–14.0)
NEUT%: 53.4 % (ref 38.4–76.8)
NEUTROS ABS: 4.3 10*3/uL (ref 1.5–6.5)
PLATELETS: 301 10*3/uL (ref 145–400)
RBC: 4.79 10*6/uL (ref 3.70–5.45)
RDW: 15 % — ABNORMAL HIGH (ref 11.2–14.5)
WBC: 8 10*3/uL (ref 3.9–10.3)
lymph#: 3 10*3/uL (ref 0.9–3.3)

## 2016-11-10 NOTE — Progress Notes (Signed)
ID: Pamela Summers OB: 1951-07-28  MR#: [id number]  ZLD#:[id number]  PCP: Shirline Frees, MD GYN:  Delila Pereyra SU: [facility] OTHER MD: Ulyess Blossom,  Merry Proud [facility]   CHIEF COMPLAINT: Ductal carcinoma in situ  CURRENT TREATMENT:  observation  BREAST CANCER HISTORY: From the original intake note:  The patient has a history of right upper outer quadrant breast cancer status post lumpectomy and axillary lymph node dissection in 1989, when the patient was 3 years old. She "met with a chemo doctor" but did not receive adjuvant chemotherapy or antiestrogens. She underwent adjuvant radiation under Dr Arloa Koh. I do not have those records.  On 09/22/2011 bilateral screening mammography at the breast Center showed a possible mass in the left breast additional studies felt this to have been a summation shadow where there was no palpable mass and ultrasound showed only normal tissue. Nevertheless left diagnostic mammography in 6 months was recommended and that was performed in August of 2013. This was again negative.  On 09/27/2012 the patient had bilateral screening mammography showing a possible mass in the right breast. Right diagnostic mammography and right breast ultrasound on 10/04/2012 showed a 7 mm ovoid density which by ultrasound measured 5 mm, at the 2:00 position in the right breast. The right axilla was unremarkable.  Ultrasound-guided biopsy of this mass to 06/13/2013 showed ([id number]) a ductal carcinoma in situ, grade 2 or 3, estrogen receptors 13% positive, with weak staining intensity, and progesterone receptor 3% positive, with weak staining intensity.  On 10/10/2012 she underwent bilateral breast MRI which showed a 2.4 cm area of clumped linear enhancement surrounding the recent biopsy clip there were no other suspicious areas in the right breast, and no findings of concern in the left breast or regional lymph node area.  With this information and after  appropriate discussion the patient proceeded to right simple mastectomy 04/14/2013. The final pathology from that procedure ([id number]) showed an area of 2.3 cm of ductal carcinoma in situ, high-grade. Margins were ample (1.2 cm from the deep margin was the closest).  The patient's subsequent history is as detailed below  INTERVAL HISTORY: Joshlynn returns today for follow-up of her ductal carcinoma in situ. The interval history is generally unremarkable. She continues under observation alone. She just had left mammography, which shows her breast density to be category "B". There was no evidence of malignancy.  REVIEW OF SYSTEMS: A detailed review of systems was otherwise entirely negative, except where noted above.   PAST MEDICAL HISTORY: Past Medical History:  Diagnosis Date  . Arthritis   . Breast cancer ([place]) 1989, 2014  . Cancer ([place])   . Complication of anesthesia    pt states"difficult to wake up"  . Hearing loss   . Heart murmur   . Hypertension   . PONV (postoperative nausea and vomiting)    "scop patch placed and does not work"    PAST SURGICAL HISTORY: Past Surgical History:  Procedure Laterality Date  . ABDOMINAL HYSTERECTOMY     partical  . BREAST BIOPSY Right 2014   [place]   . BREAST LUMPECTOMY Right 1990  . BREAST SURGERY  1990   lumpectomy - right  . GANGLION CYST EXCISION  2002 - approximate  . MASTECTOMY Right 2014  . STAPEDES SURGERY Right 2011  . TOTAL MASTECTOMY Right 04/14/2013   Procedure: TOTAL MASTECTOMY;  Surgeon: Haywood Lasso, MD;  Location: [facility];  Service: General;  Laterality: Right;    FAMILY  HISTORY Family History  Problem Relation Age of Onset  . Cancer Mother     uterine or ovarian cancer  . Heart disease Father   . Cancer Sister     maternal half sister with uterine or cervical cancer; died in her 51s  . Cancer Maternal Uncle     unknown cancer  . Cancer Cousin     3 maternal cousins with unknown cancers  . Breast cancer Neg  Hx    the family history is detailed extensively in the genetics consult in brief: The patient has very little information about her father. Her mother died before the age of 15 from either uterine or cervical cancer. The patient had 6 maternal half siblings and 4 paternal half siblings. There is no history of breast or ovarian cancer in the family as far as she can tell  GYNECOLOGIC HISTORY:  Menarche age 3, first live birth age 64, the patient is GX P1. She status post simple hysterectomy, without salpingo-oophorectomy. She did not use hormone replacement.  SOCIAL HISTORY:   Clela works as a Company secretary, third shift, for the [employer]. She lives by herself, with no pets. Her daughter Tynleigh Birt lives in [place] and works in a Nurse, learning disability. The patient has 2 grandchildren. She attends a local [employer] DIRECTIVES:  in place. The patient's daughter is her healthcare power of attorney. Hassan Rowan can be reached at [place]ice: Social History  Substance Use Topics  . Smoking status: Never Smoker  . Smokeless tobacco: Never Used  . Alcohol use No     Colonoscopy: 2010?/ [facility]  PAP: Status post hysterectomy   Bone density: At Dr.Mezer's/ "normal" and he  Lipid panel:  No Known Allergies  Current Outpatient Prescriptions  Medication Sig Dispense Refill  . atorvastatin (LIPITOR) 20 MG tablet Take 20 mg by mouth daily.    . cholecalciferol (VITAMIN D) 1000 UNITS tablet Take 1,000 Units by mouth daily.    . hydrochlorothiazide (MICROZIDE) 12.5 MG capsule Take 12.5 mg by mouth daily. Reported on 08/23/2015    . metoprolol succinate (TOPROL-XL) 25 MG 24 hr tablet Take 1 tablet (25 mg total) by mouth daily. 90 tablet 3  . traMADol (ULTRAM) 50 MG tablet Take 1 tablet (50 mg total) by mouth every 8 (eight) hours as needed. 30 tablet 0   No current facility-administered medications for this visit.     OBJECTIVE: Middle-aged  [place] American woman  Who appears well Vitals:   11/10/16 0931  BP: (!) 153/77  Pulse: (!) 56  Resp: 18  Temp: 97.8 F (36.6 C)     Body mass index is 26.7 kg/m.    ECOG FS:0 - Asymptomatic  Sclerae unicteric, pupils round and equal Oropharynx clear and moist-- no thrush or other lesions No cervical or supraclavicular adenopathy Lungs no rales or rhonchi Heart regular rate and rhythm Abd soft, nontender, positive bowel sounds MSK no focal spinal tenderness, no upper extremity lymphedema Neuro: nonfocal, well oriented, appropriate affect Breasts: The right breast is status post mastectomy. There is no evidence of chest wall recurrence. The left breast is unremarkable. Both axillae are benign.   LAB RESULTS:  CMP     Component Value Date/Time   NA 142 11/12/2015 0926   K 3.8 11/12/2015 0926   CL 95 (L) 03/13/2014 1437   CO2 25 11/12/2015 0926   GLUCOSE 99 11/12/2015 0926   BUN 12.9 11/12/2015 0926  CREATININE 0.8 11/12/2015 0926   CALCIUM 9.3 11/12/2015 0926   PROT 7.7 11/12/2015 0926   ALBUMIN 3.8 11/12/2015 0926   AST 17 11/12/2015 0926   ALT 16 11/12/2015 0926   ALKPHOS 76 11/12/2015 0926   BILITOT 0.35 11/12/2015 0926   GFRNONAA >89 03/13/2014 1437   GFRAA >89 03/13/2014 1437    I No results found for: SPEP  Lab Results  Component Value Date   WBC 8.5 11/12/2015   NEUTROABS 4.3 11/12/2015   HGB 13.5 11/12/2015   HCT 41.7 11/12/2015   MCV 80.3 11/12/2015   PLT 306 11/12/2015      Chemistry      Component Value Date/Time   NA 142 11/12/2015 0926   K 3.8 11/12/2015 0926   CL 95 (L) 03/13/2014 1437   CO2 25 11/12/2015 0926   BUN 12.9 11/12/2015 0926   CREATININE 0.8 11/12/2015 0926      Component Value Date/Time   CALCIUM 9.3 11/12/2015 0926   ALKPHOS 76 11/12/2015 0926   AST 17 11/12/2015 0926   ALT 16 11/12/2015 0926   BILITOT 0.35 11/12/2015 0926       No results found for: LABCA2  No components found for: LABCA125  No results for  input(s): INR in the last 168 hours.  Urinalysis    Component Value Date/Time   COLORURINE AMBER (A) 03/08/2014 2350   APPEARANCEUR CLOUDY (A) 03/08/2014 2350   LABSPEC 1.031 (H) 03/08/2014 2350   PHURINE 6.0 03/08/2014 2350   GLUCOSEU NEGATIVE 03/08/2014 2350   HGBUR MODERATE (A) 03/08/2014 2350   BILIRUBINUR negative 03/24/2014 1303   KETONESUR 40 (A) 03/08/2014 2350   PROTEINUR 30 03/24/2014 1303   PROTEINUR >300 (A) 03/08/2014 2350   UROBILINOGEN 0.2 03/24/2014 1303   UROBILINOGEN 0.2 03/08/2014 2350   NITRITE negative 03/24/2014 1303   NITRITE NEGATIVE 03/08/2014 2350   LEUKOCYTESUR Trace 03/24/2014 1303    STUDIES: Mm Screening Breast Tomo Uni L  Result Date: 11/03/2016 CLINICAL DATA:  Screening. EXAM: 2D DIGITAL SCREENING UNILATERAL LEFT MAMMOGRAM WITH CAD AND ADJUNCT TOMO COMPARISON:  Previous exam(s). ACR Breast Density Category b: There are scattered areas of fibroglandular density. FINDINGS: There are no findings suspicious for malignancy. Images were processed with CAD. IMPRESSION: No mammographic evidence of malignancy. A result letter of this screening mammogram will be mailed directly to the patient. RECOMMENDATION: Screening mammogram in one year. (Code:SM-B-01Y) BI-RADS CATEGORY  1: Negative. Electronically Signed   By: Curlene Dolphin M.D.   On: 11/03/2016 12:07     ASSESSMENT: 66 y.o. BRCA negative [place] woman  (1) status post right lumpectomy and axillary lymph node dissection in 1989 for an upper outer quadrant invasive carcinoma, status post radiation, no chemotherapy or antiestrogen therapy given  (2) status post right simple mastectomy 04/14/2013  for in upper inner quadrant 2.3 cm ductal carcinoma in situ, high-grade, estrogen receptor 13% and progesterone receptors 3% positive, both weakly staining, with ample margins  (3) the patient opted against reconstruction or antiestrogen prophylaxis  PLAN: Nikkole Is now 3-1/2 years out from definitive surgery  for her noninvasive breast cancer.  We discussed the fact that mastectomy is generally curative for ductal carcinoma in situ. She has a small chance of developing a new breast cancer in the opposite breast, approximately 1/2% per year. She has very non-dense breasts and therefore mammography showed alert [place] early if a problem were to develop  She needs new bras and prostheses and I was glad to write her that  prescription  At this point I feel comfortable releasing her back to her primary care physician. All she will need in terms of breast cancer screening is her yearly mammography and yearly physician chest wall and breast exam  I will be glad to see Miu again at any point in the future if on when the need arises, as of now are making no further routine appointment for her here.  Chauncey Cruel, MD   11/10/2016 9:32 AM

## 2016-11-17 DIAGNOSIS — K080 Exfoliation of teeth due to systemic causes: Secondary | ICD-10-CM | POA: Diagnosis not present

## 2016-11-30 IMAGING — CR DG HIP (WITH OR WITHOUT PELVIS) 2-3V*L*
2 series · 2 of 2 positions shown · non-contrast
Comparison: None.

CLINICAL DATA: One week history of pain.  No known injury

EXAM:
DG HIP (WITH OR WITHOUT PELVIS) 2-3V LEFT

[AP]
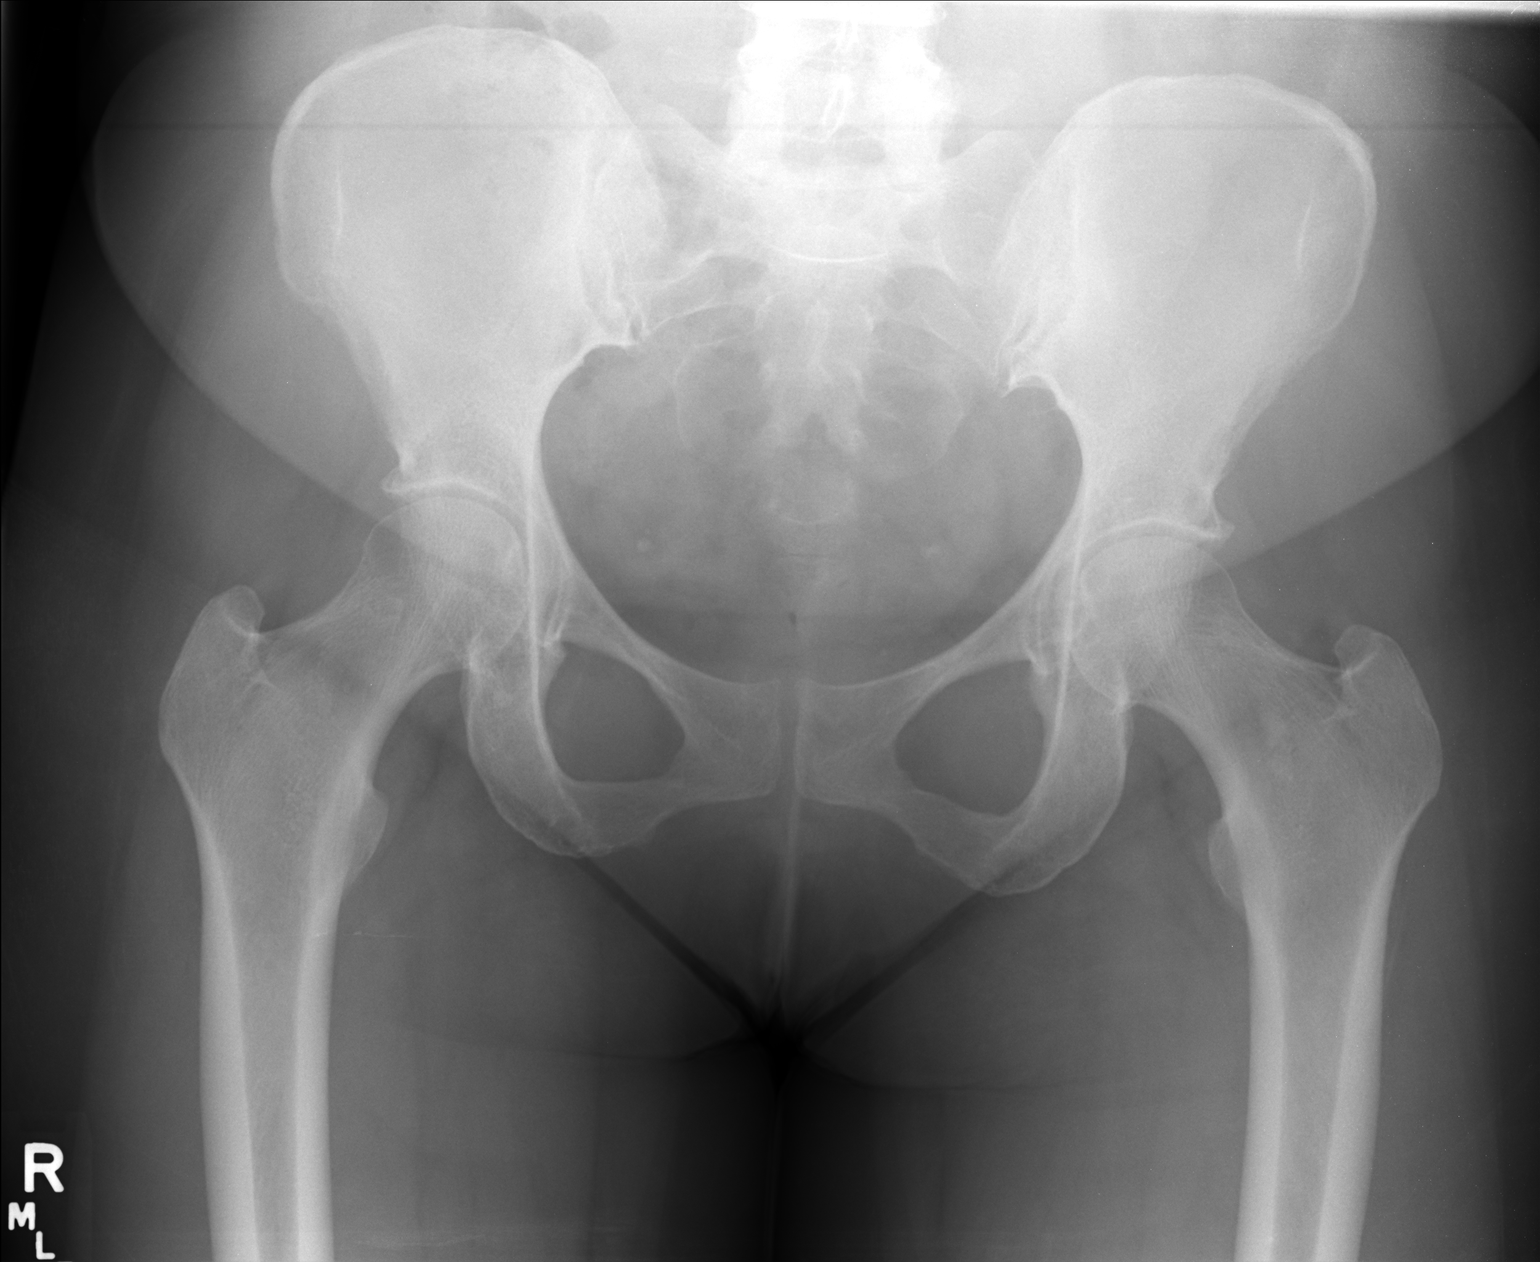

[lateral]
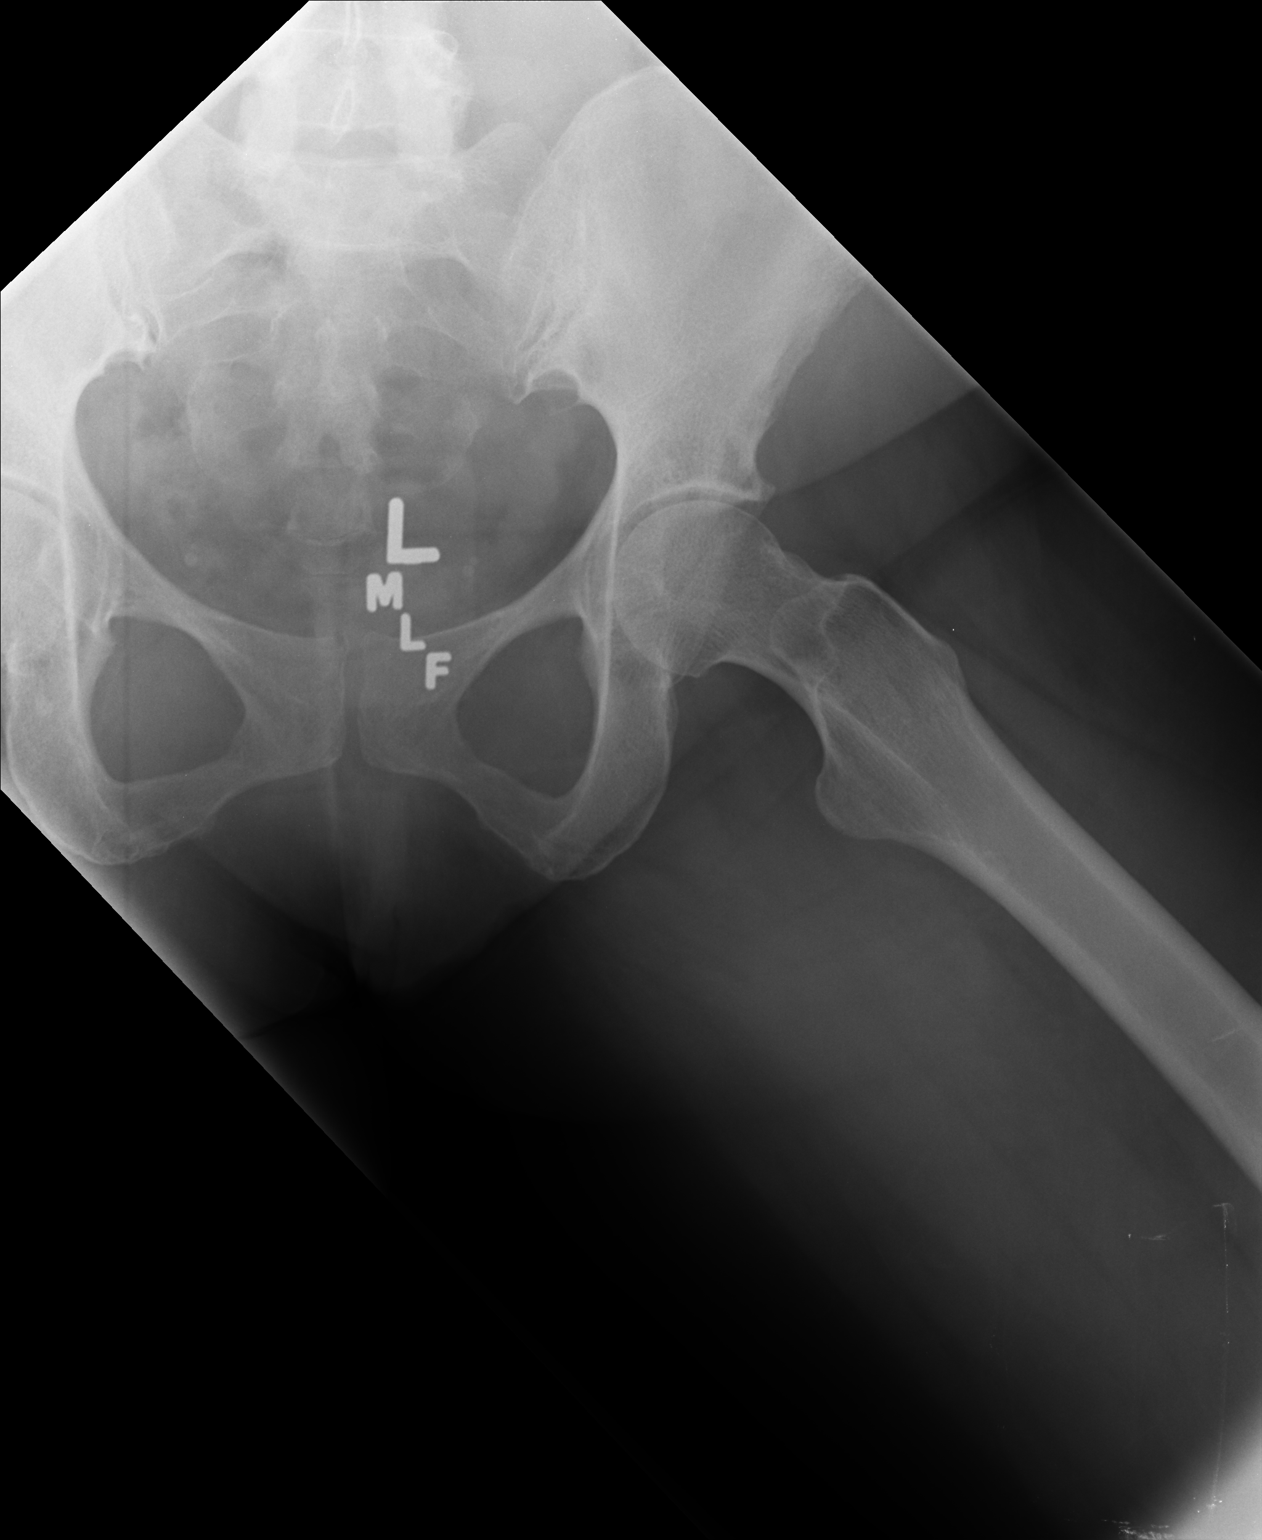

[2 of 2 positions shown; findings below may reference images not displayed]

FINDINGS: Frontal pelvis as well as lateral left hip images were obtained.
There is slight symmetric narrowing of both hip joints. No fracture
or dislocation. No erosive change. There are small phleboliths in
the pelvis.
IMPRESSION: Slight symmetric narrowing of both hip joints. No fracture or
dislocation.

## 2016-12-01 DIAGNOSIS — K080 Exfoliation of teeth due to systemic causes: Secondary | ICD-10-CM | POA: Diagnosis not present

## 2017-01-13 DIAGNOSIS — I10 Essential (primary) hypertension: Secondary | ICD-10-CM | POA: Diagnosis not present

## 2017-01-13 DIAGNOSIS — E7800 Pure hypercholesterolemia, unspecified: Secondary | ICD-10-CM | POA: Diagnosis not present

## 2017-02-23 DIAGNOSIS — N842 Polyp of vagina: Secondary | ICD-10-CM | POA: Diagnosis not present

## 2017-02-23 DIAGNOSIS — N898 Other specified noninflammatory disorders of vagina: Secondary | ICD-10-CM | POA: Diagnosis not present

## 2017-02-23 DIAGNOSIS — N3946 Mixed incontinence: Secondary | ICD-10-CM | POA: Diagnosis not present

## 2017-02-23 DIAGNOSIS — Z01411 Encounter for gynecological examination (general) (routine) with abnormal findings: Secondary | ICD-10-CM | POA: Diagnosis not present

## 2017-03-10 DIAGNOSIS — R7303 Prediabetes: Secondary | ICD-10-CM | POA: Diagnosis not present

## 2017-04-13 DIAGNOSIS — Z780 Asymptomatic menopausal state: Secondary | ICD-10-CM | POA: Diagnosis not present

## 2017-04-13 DIAGNOSIS — Z13820 Encounter for screening for osteoporosis: Secondary | ICD-10-CM | POA: Diagnosis not present

## 2017-04-21 DIAGNOSIS — R7303 Prediabetes: Secondary | ICD-10-CM | POA: Diagnosis not present

## 2017-04-21 DIAGNOSIS — E7800 Pure hypercholesterolemia, unspecified: Secondary | ICD-10-CM | POA: Diagnosis not present

## 2017-04-21 DIAGNOSIS — I10 Essential (primary) hypertension: Secondary | ICD-10-CM | POA: Diagnosis not present

## 2017-05-18 DIAGNOSIS — H906 Mixed conductive and sensorineural hearing loss, bilateral: Secondary | ICD-10-CM | POA: Diagnosis not present

## 2017-05-18 DIAGNOSIS — H748X2 Other specified disorders of left middle ear and mastoid: Secondary | ICD-10-CM | POA: Diagnosis not present

## 2017-05-18 DIAGNOSIS — H6522 Chronic serous otitis media, left ear: Secondary | ICD-10-CM | POA: Diagnosis not present

## 2017-05-18 DIAGNOSIS — H8001 Otosclerosis involving oval window, nonobliterative, right ear: Secondary | ICD-10-CM | POA: Diagnosis not present

## 2017-05-25 DIAGNOSIS — K080 Exfoliation of teeth due to systemic causes: Secondary | ICD-10-CM | POA: Diagnosis not present

## 2017-06-01 DIAGNOSIS — R3915 Urgency of urination: Secondary | ICD-10-CM | POA: Diagnosis not present

## 2017-06-01 DIAGNOSIS — R7303 Prediabetes: Secondary | ICD-10-CM | POA: Diagnosis not present

## 2017-06-08 DIAGNOSIS — H906 Mixed conductive and sensorineural hearing loss, bilateral: Secondary | ICD-10-CM | POA: Diagnosis not present

## 2017-06-08 DIAGNOSIS — H748X2 Other specified disorders of left middle ear and mastoid: Secondary | ICD-10-CM | POA: Diagnosis not present

## 2017-06-08 DIAGNOSIS — H8001 Otosclerosis involving oval window, nonobliterative, right ear: Secondary | ICD-10-CM | POA: Diagnosis not present

## 2017-06-08 DIAGNOSIS — H6522 Chronic serous otitis media, left ear: Secondary | ICD-10-CM | POA: Diagnosis not present

## 2017-06-15 DIAGNOSIS — K080 Exfoliation of teeth due to systemic causes: Secondary | ICD-10-CM | POA: Diagnosis not present

## 2017-07-09 DIAGNOSIS — H906 Mixed conductive and sensorineural hearing loss, bilateral: Secondary | ICD-10-CM | POA: Diagnosis not present

## 2017-07-09 DIAGNOSIS — H8001 Otosclerosis involving oval window, nonobliterative, right ear: Secondary | ICD-10-CM | POA: Diagnosis not present

## 2017-07-09 DIAGNOSIS — H748X2 Other specified disorders of left middle ear and mastoid: Secondary | ICD-10-CM | POA: Diagnosis not present

## 2017-07-09 DIAGNOSIS — H6522 Chronic serous otitis media, left ear: Secondary | ICD-10-CM | POA: Diagnosis not present

## 2017-07-11 IMAGING — CR DG CHEST 2V
2 series · 2 of 2 positions shown · non-contrast
Comparison: 12/20/2014.  04/11/2013

CLINICAL DATA: History of heart murmur. History of hypertension.
Preoperative chest x-ray .

EXAM:
CHEST  2 VIEW

[w chest pa]
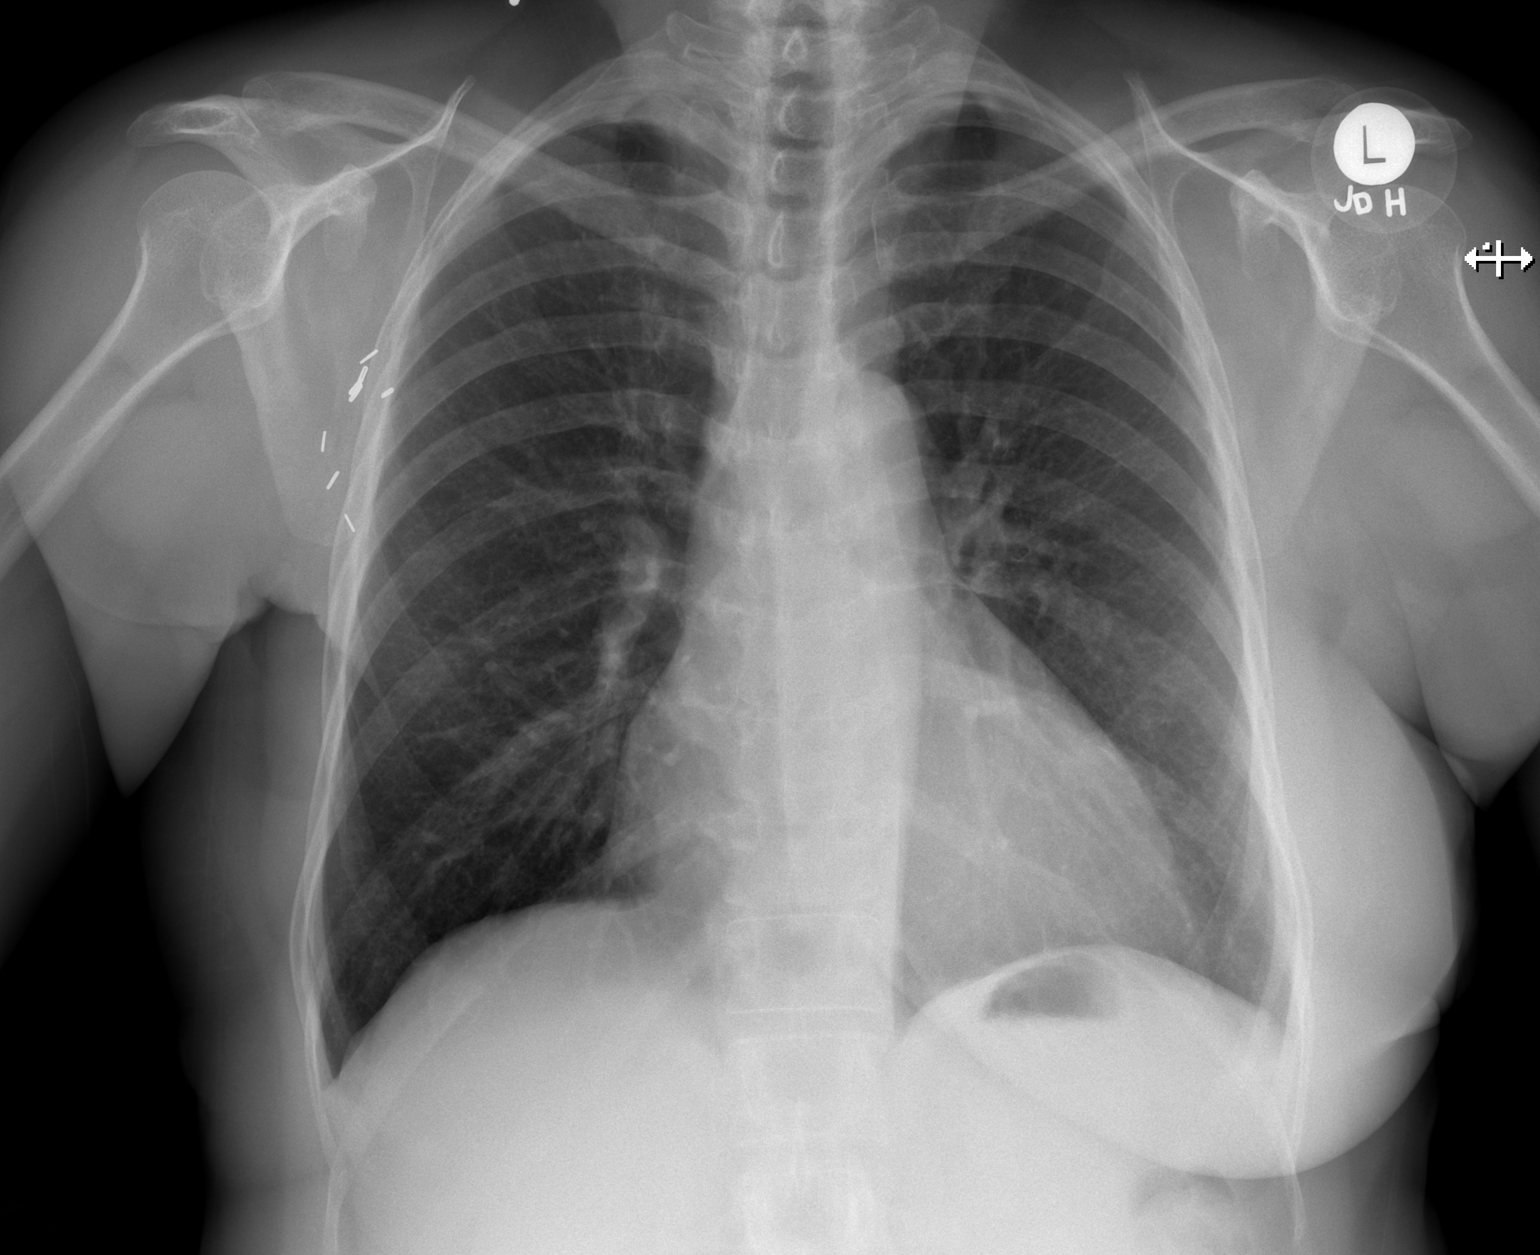

[w chest lat]
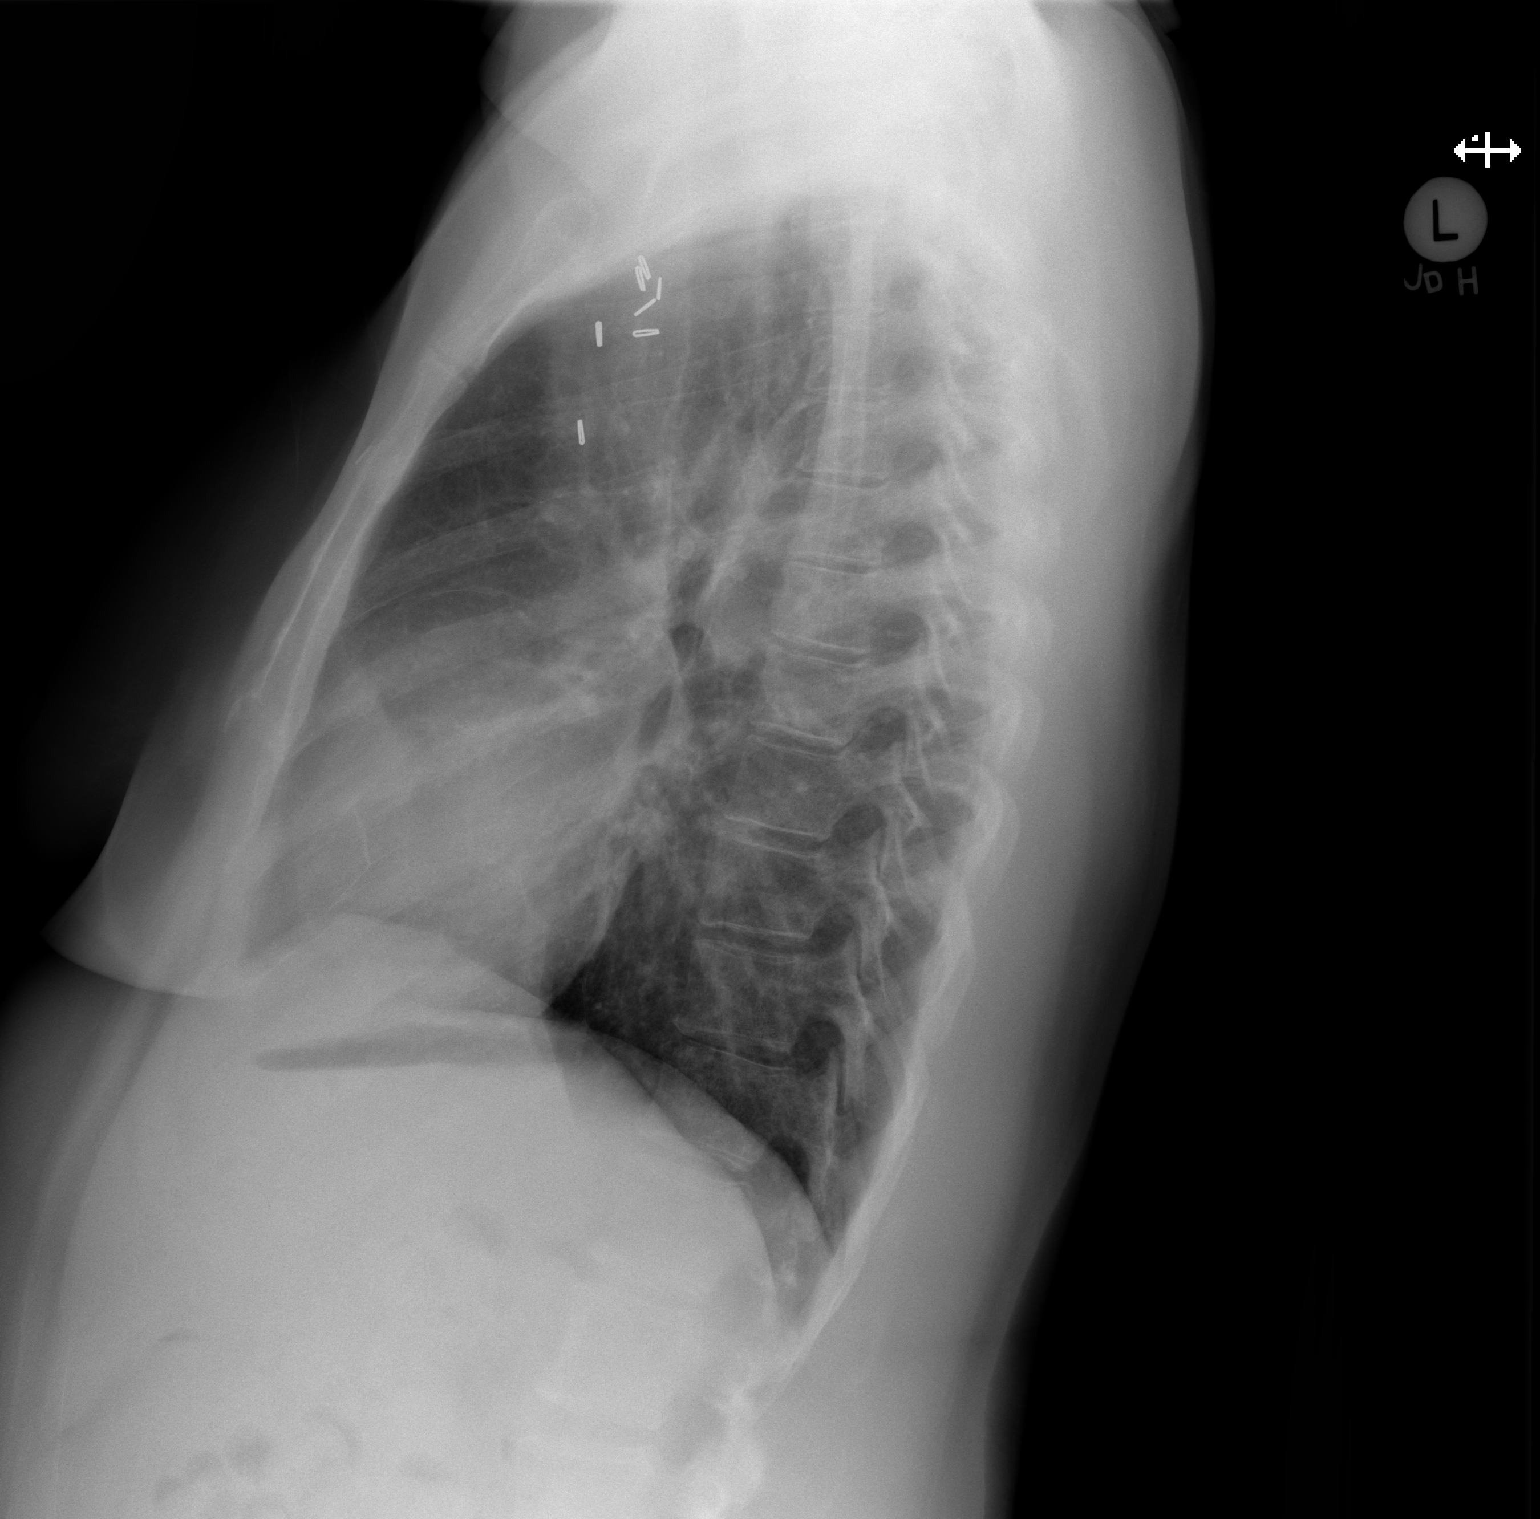

[2 of 2 positions shown; findings below may reference images not displayed]

FINDINGS: Mediastinum and hilar structures normal. Stable mild cardiomegaly
with normal pulmonary vascularity. Prominent epicardial fat pad. No
focal infiltrate. No pleural effusion or pneumothorax. Right
mastectomy. Surgical clips right axilla.
IMPRESSION: 1.  Stable mild cardiomegaly.  No CHF.  No acute pulmonary disease .

2. Right mastectomy .

## 2017-07-21 DIAGNOSIS — H25813 Combined forms of age-related cataract, bilateral: Secondary | ICD-10-CM | POA: Diagnosis not present

## 2017-07-21 DIAGNOSIS — H40013 Open angle with borderline findings, low risk, bilateral: Secondary | ICD-10-CM | POA: Diagnosis not present

## 2017-07-21 DIAGNOSIS — H04123 Dry eye syndrome of bilateral lacrimal glands: Secondary | ICD-10-CM | POA: Diagnosis not present

## 2017-07-31 DIAGNOSIS — H748X2 Other specified disorders of left middle ear and mastoid: Secondary | ICD-10-CM | POA: Diagnosis not present

## 2017-07-31 DIAGNOSIS — H906 Mixed conductive and sensorineural hearing loss, bilateral: Secondary | ICD-10-CM | POA: Diagnosis not present

## 2017-07-31 DIAGNOSIS — H6522 Chronic serous otitis media, left ear: Secondary | ICD-10-CM | POA: Diagnosis not present

## 2017-07-31 DIAGNOSIS — H6622 Chronic atticoantral suppurative otitis media, left ear: Secondary | ICD-10-CM | POA: Diagnosis not present

## 2017-08-06 DIAGNOSIS — R7303 Prediabetes: Secondary | ICD-10-CM | POA: Diagnosis not present

## 2017-08-06 DIAGNOSIS — K080 Exfoliation of teeth due to systemic causes: Secondary | ICD-10-CM | POA: Diagnosis not present

## 2017-08-06 DIAGNOSIS — E7800 Pure hypercholesterolemia, unspecified: Secondary | ICD-10-CM | POA: Diagnosis not present

## 2017-08-06 DIAGNOSIS — I10 Essential (primary) hypertension: Secondary | ICD-10-CM | POA: Diagnosis not present

## 2017-08-10 DIAGNOSIS — Z0000 Encounter for general adult medical examination without abnormal findings: Secondary | ICD-10-CM | POA: Diagnosis not present

## 2017-08-19 DIAGNOSIS — K080 Exfoliation of teeth due to systemic causes: Secondary | ICD-10-CM | POA: Diagnosis not present

## 2017-08-31 DIAGNOSIS — H6982 Other specified disorders of Eustachian tube, left ear: Secondary | ICD-10-CM | POA: Diagnosis not present

## 2017-08-31 DIAGNOSIS — H748X2 Other specified disorders of left middle ear and mastoid: Secondary | ICD-10-CM | POA: Diagnosis not present

## 2017-08-31 DIAGNOSIS — H906 Mixed conductive and sensorineural hearing loss, bilateral: Secondary | ICD-10-CM | POA: Diagnosis not present

## 2017-08-31 DIAGNOSIS — H8001 Otosclerosis involving oval window, nonobliterative, right ear: Secondary | ICD-10-CM | POA: Diagnosis not present

## 2017-09-28 ENCOUNTER — Other Ambulatory Visit: Payer: Self-pay | Admitting: Obstetrics and Gynecology

## 2017-09-28 DIAGNOSIS — Z1231 Encounter for screening mammogram for malignant neoplasm of breast: Secondary | ICD-10-CM

## 2017-09-29 ENCOUNTER — Encounter: Payer: Self-pay | Admitting: Genetic Counselor

## 2017-09-29 DIAGNOSIS — Z1379 Encounter for other screening for genetic and chromosomal anomalies: Secondary | ICD-10-CM | POA: Insufficient documentation

## 2017-11-09 ENCOUNTER — Ambulatory Visit
Admission: RE | Admit: 2017-11-09 | Discharge: 2017-11-09 | Disposition: A | Discharge status: Home / Self Care | Payer: Federal, State, Local not specified - PPO | Source: Ambulatory Visit | Attending: Obstetrics and Gynecology | Admitting: Obstetrics and Gynecology

## 2017-11-09 ENCOUNTER — Ambulatory Visit: Payer: Federal, State, Local not specified - PPO

## 2017-11-09 DIAGNOSIS — Z1231 Encounter for screening mammogram for malignant neoplasm of breast: Secondary | ICD-10-CM

## 2017-11-10 ENCOUNTER — Other Ambulatory Visit: Payer: Self-pay | Admitting: Obstetrics and Gynecology

## 2017-11-10 DIAGNOSIS — R928 Other abnormal and inconclusive findings on diagnostic imaging of breast: Secondary | ICD-10-CM

## 2017-11-17 ENCOUNTER — Other Ambulatory Visit: Payer: Self-pay | Admitting: Obstetrics and Gynecology

## 2017-11-17 ENCOUNTER — Ambulatory Visit
Admission: RE | Admit: 2017-11-17 | Discharge: 2017-11-17 | Disposition: A | Discharge status: Home / Self Care | Payer: Federal, State, Local not specified - PPO | Source: Ambulatory Visit | Attending: Obstetrics and Gynecology | Admitting: Obstetrics and Gynecology

## 2017-11-17 DIAGNOSIS — R928 Other abnormal and inconclusive findings on diagnostic imaging of breast: Secondary | ICD-10-CM

## 2017-11-17 DIAGNOSIS — N6489 Other specified disorders of breast: Secondary | ICD-10-CM | POA: Diagnosis not present

## 2017-11-17 HISTORY — DX: Personal history of irradiation: Z92.3

## 2017-11-18 ENCOUNTER — Other Ambulatory Visit: Payer: Self-pay | Admitting: Obstetrics and Gynecology

## 2017-11-23 DIAGNOSIS — K080 Exfoliation of teeth due to systemic causes: Secondary | ICD-10-CM | POA: Diagnosis not present

## 2018-02-02 DIAGNOSIS — I10 Essential (primary) hypertension: Secondary | ICD-10-CM | POA: Diagnosis not present

## 2018-02-02 DIAGNOSIS — R7303 Prediabetes: Secondary | ICD-10-CM | POA: Diagnosis not present

## 2018-02-02 DIAGNOSIS — E7800 Pure hypercholesterolemia, unspecified: Secondary | ICD-10-CM | POA: Diagnosis not present

## 2018-02-12 ENCOUNTER — Other Ambulatory Visit: Payer: Self-pay | Admitting: *Deleted

## 2018-02-12 DIAGNOSIS — C50211 Malignant neoplasm of upper-inner quadrant of right female breast: Secondary | ICD-10-CM

## 2018-02-12 DIAGNOSIS — Z170 Estrogen receptor positive status [ER+]: Principal | ICD-10-CM

## 2018-02-14 NOTE — Progress Notes (Signed)
ID: Pamela Summers OB: [birth year]  MR#: [id number]  SPQ#:[id number]  PCP: Pamela Frees, MD GYN:  Pamela Summers SU: Pamela Summers [facility] OTHER MD: Pamela Summers,  Pamela Summers   CHIEF COMPLAINT: Ductal carcinoma in situ  CURRENT TREATMENT:  observation  BREAST CANCER HISTORY: From the original intake note:  The patient has a history of right upper outer quadrant breast cancer status post lumpectomy and axillary lymph node dissection in 1989, when the patient was 47 years old. She "met with a chemo doctor" but did not receive adjuvant chemotherapy or antiestrogens. She underwent adjuvant radiation under Dr Pamela Summers. I do not have those records.  On 09/22/2011 bilateral screening mammography at the breast Center showed a possible mass in the left breast additional studies felt this to have been a summation shadow where there was no palpable mass and ultrasound showed only normal tissue. Nevertheless left diagnostic mammography in 6 months was recommended and that was performed in August of 2013. This was again negative.  On 09/27/2012 the patient had bilateral screening mammography showing a possible mass in the right breast. Right diagnostic mammography and right breast ultrasound on 10/04/2012 showed a 7 mm ovoid density which by ultrasound measured 5 mm, at the 2:00 position in the right breast. The right axilla was unremarkable.  Ultrasound-guided biopsy of this mass to 06/13/2013 showed ([id number]) a ductal carcinoma in situ, grade 2 or 3, estrogen receptors 13% positive, with weak staining intensity, and progesterone receptor 3% positive, with weak staining intensity.  On 10/10/2012 she underwent bilateral breast MRI which showed a 2.4 cm area of clumped linear enhancement surrounding the recent biopsy clip there were no other suspicious areas in the right breast, and no findings of concern in the left breast or regional lymph node area.  With this information and after  appropriate discussion the patient proceeded to right simple mastectomy 04/14/2013. The final pathology from that procedure ([id number]) showed an area of 2.3 cm of ductal carcinoma in situ, high-grade. Margins were ample (1.2 cm from the deep margin was the closest).  The patient's subsequent history is as detailed below  INTERVAL HISTORY: Pamela Summers had been released from follow-up at her last visit here.  However apparently there was an appointment that was not canceled.  They called her and she showed up although she did call to try to find out if there was a special problem since she understood she had been released and ensured her visit here should not have occurred and therefore she should not have to pay a co-pay.  Since her last visit, she underwent screening unilateral left mammography with CAD and tomography on 11/10/2017 at [place] showing: breast density category B. There was a possible asymmetry in the left breast. She completed diagnostic left breast mammography and left ultrasonography showing: asymmetry without suspicious mass or distortion in the upper outer quadrant is felt to represent benign breast parenchyma. She is scheduled for a 6 month follow up on 05/24/2018.   REVIEW OF SYSTEMS: Pamela Summers reports that she is confused of why she had an appointment today, since her last visit was her "graduation" day. She denies unusual headaches, visual changes, nausea, vomiting, or dizziness. There has been no unusual cough, phlegm production, or pleurisy. This been no change in bowel or bladder habits. She denies unexplained fatigue or unexplained weight loss, bleeding, rash, or fever. A detailed review of systems was otherwise stable.    PAST MEDICAL HISTORY: Past Medical History:  Diagnosis  Date  . Arthritis   . Breast cancer ([place]) 1989, 2014  . Cancer ([place])   . Complication of anesthesia    pt states"difficult to wake up"  . Hearing loss   . Heart murmur   . Hypertension   .  Personal history of radiation therapy   . PONV (postoperative nausea and vomiting)    "scop patch placed and does not work"    PAST SURGICAL HISTORY: Past Surgical History:  Procedure Laterality Date  . ABDOMINAL HYSTERECTOMY     partical  . BREAST BIOPSY Right 2014   [place]   . BREAST LUMPECTOMY Right 1990  . BREAST SURGERY  1990   lumpectomy - right  . GANGLION CYST EXCISION  2002 - approximate  . MASTECTOMY Right 2014  . STAPEDES SURGERY Right 2011  . TOTAL MASTECTOMY Right 04/14/2013   Procedure: TOTAL MASTECTOMY;  Surgeon: Pamela Lasso, MD;  Location: [facility] OR;  Service: General;  Laterality: Right;    FAMILY HISTORY Family History  Problem Relation Age of Onset  . Cancer Mother        uterine or ovarian cancer  . Heart disease Father   . Cancer Sister        maternal half sister with uterine or cervical cancer; died in her 20s  . Cancer Maternal Uncle        unknown cancer  . Cancer Cousin        3 maternal cousins with unknown cancers  . Breast cancer Neg Hx    the family history is detailed extensively in the genetics consult in brief: The patient has very little information about her father. Her mother died before the age of 5 from either uterine or cervical cancer. The patient had 6 maternal half siblings and 4 paternal half siblings. There is no history of breast or ovarian cancer in the family as far as she can tell  GYNECOLOGIC HISTORY:  Menarche age 89, first live birth age 56, the patient is GX P1. She status post simple hysterectomy, without salpingo-oophorectomy. She did not use hormone replacement.  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 She lives by herself, with no pets. Her daughter Pamela Summers lives in [place] and works in a Nurse, learning disability. The patient has 2 grandchildren. She attends a local [place] DIRECTIVES:  in place. The patient's daughter is her healthcare  power of attorney. Hassan Rowan can be reached at [place]: Social History   Tobacco Use  . Smoking status: Never Smoker  . Smokeless tobacco: Never Used  Substance Use Topics  . Alcohol use: No  . Drug use: No     Colonoscopy: 2010?/ Summers  PAP: Status post hysterectomy   Bone density: At Dr.Mezer's/ "normal" and he  Lipid panel:  No Known Allergies  Current Outpatient Medications  Medication Sig Dispense Refill  . atorvastatin (LIPITOR) 20 MG tablet Take 20 mg by mouth daily.    . cholecalciferol (VITAMIN D) 1000 UNITS tablet Take 1,000 Units by mouth daily.    . hydrochlorothiazide (MICROZIDE) 12.5 MG capsule Take 12.5 mg by mouth daily. Reported on 08/23/2015    . metoprolol succinate (TOPROL-XL) 25 MG 24 hr tablet Take 1 tablet (25 mg total) by mouth daily. 90 tablet 3  . traMADol (ULTRAM) 50 MG tablet Take 1 tablet (50 mg total) by mouth every 8 (eight) hours as needed. [place]  tablet 0   No current facility-administered medications for this visit.     OBJECTIVE: Middle-aged [place] American woman in no acute distress  Vitals:   02/15/18 1317  BP: 139/81  Pulse: (!) 50  Resp: 18  Temp: 98.6 F (37 C)  SpO2: 99%     Body mass index is 22.94 kg/m.    ECOG FS:0 - Asymptomatic  Sclerae unicteric, EOMs intact Oropharynx clear and moist No cervical or supraclavicular adenopathy Lungs no rales or rhonchi Heart regular rate and rhythm Abd soft, nontender, positive bowel sounds MSK no focal spinal tenderness, no upper extremity lymphedema Neuro: nonfocal, well oriented, appropriate affect Breasts: The right breast has undergone mastectomy.  There is no evidence of chest wall recurrence.  The left breast is benign.  Both axillae are benign.  LAB RESULTS:  CMP     Component Value Date/Time   NA 140 02/15/2018 1300   NA 142 11/10/2016 0933   K 3.9 02/15/2018 1300   K 4.0 11/10/2016 0933   CL 106 02/15/2018 1300   CO2 27 02/15/2018 1300   CO2 23  11/10/2016 0933   GLUCOSE 84 02/15/2018 1300   GLUCOSE 95 11/10/2016 0933   BUN 14 02/15/2018 1300   BUN 10.9 11/10/2016 0933   CREATININE 0.80 02/15/2018 1300   CREATININE 0.8 11/10/2016 0933   CALCIUM 9.6 02/15/2018 1300   CALCIUM 9.6 11/10/2016 0933   PROT 7.5 02/15/2018 1300   PROT 7.5 11/10/2016 0933   ALBUMIN 4.0 02/15/2018 1300   ALBUMIN 3.7 11/10/2016 0933   AST 19 02/15/2018 1300   AST 16 11/10/2016 0933   ALT 20 02/15/2018 1300   ALT 17 11/10/2016 0933   ALKPHOS 71 02/15/2018 1300   ALKPHOS 79 11/10/2016 0933   BILITOT 0.3 02/15/2018 1300   BILITOT 0.30 11/10/2016 0933   GFRNONAA >60 02/15/2018 1300   GFRNONAA >89 03/13/2014 1437   GFRAA >60 02/15/2018 1300   GFRAA >89 03/13/2014 1437    I No results found for: SPEP  Lab Results  Component Value Date   WBC 6.7 02/15/2018   NEUTROABS 3.5 02/15/2018   HGB 12.3 02/15/2018   HCT 38.9 02/15/2018   MCV 81.2 02/15/2018   PLT 271 02/15/2018      Chemistry      Component Value Date/Time   NA 140 02/15/2018 1300   NA 142 11/10/2016 0933   K 3.9 02/15/2018 1300   K 4.0 11/10/2016 0933   CL 106 02/15/2018 1300   CO2 27 02/15/2018 1300   CO2 23 11/10/2016 0933   BUN 14 02/15/2018 1300   BUN 10.9 11/10/2016 0933   CREATININE 0.80 02/15/2018 1300   CREATININE 0.8 11/10/2016 0933      Component Value Date/Time   CALCIUM 9.6 02/15/2018 1300   CALCIUM 9.6 11/10/2016 0933   ALKPHOS 71 02/15/2018 1300   ALKPHOS 79 11/10/2016 0933   AST 19 02/15/2018 1300   AST 16 11/10/2016 0933   ALT 20 02/15/2018 1300   ALT 17 11/10/2016 0933   BILITOT 0.3 02/15/2018 1300   BILITOT 0.30 11/10/2016 0933       No results found for: LABCA2  No components found for: LABCA125  No results for input(s): INR in the last 168 hours.  Urinalysis    Component Value Date/Time   COLORURINE AMBER (A) 03/08/2014 2350   APPEARANCEUR CLOUDY (A) 03/08/2014 2350   LABSPEC 1.031 (H) 03/08/2014 2350   PHURINE 6.0 03/08/2014 2350    GLUCOSEU NEGATIVE 03/08/2014  2350   HGBUR MODERATE (A) 03/08/2014 2350   BILIRUBINUR negative 03/24/2014 1303   KETONESUR 40 (A) 03/08/2014 2350   PROTEINUR 30 03/24/2014 1303   PROTEINUR >300 (A) 03/08/2014 2350   UROBILINOGEN 0.2 03/24/2014 1303   UROBILINOGEN 0.2 03/08/2014 2350   NITRITE negative 03/24/2014 1303   NITRITE NEGATIVE 03/08/2014 2350   LEUKOCYTESUR Trace 03/24/2014 1303    STUDIES: No results found. I, Lurline Del MD, have reviewed the above documentation for accuracy and completeness, and I agree with the above.   ASSESSMENT: 67 y.o. BRCA negative [place] woman  (1) status post right lumpectomy and axillary lymph node dissection in 1989 for an upper outer quadrant invasive carcinoma, status post radiation, no chemotherapy or antiestrogen therapy given  (2) status post right simple mastectomy 04/14/2013  for in upper inner quadrant 2.3 cm ductal carcinoma in situ, high-grade, estrogen receptor 13% and progesterone receptors 3% positive, both weakly staining, with ample margins  (3) the patient opted against reconstruction or antiestrogen prophylaxis  PLAN: Jonay is now just about 5 years out from definitive surgery for her noninvasive breast cancer.  She was actually released last visit but we had not canceled this appointment so she did show.  We will try to reimburse her $40 co-pay.  Since she was here we did discuss her mammogram and I reassured her very likely the repeat in September will be benign.  If not of course I will be glad to see her again.  I did give her a repeat prescription for additional bras  She knows to call for any issues that may develop in the future.  As of now I am not making any further routine appointments for her here  Shawnie Dapper   02/15/2018 1:47 PM

## 2018-02-15 ENCOUNTER — Inpatient Hospital Stay: Payer: Federal, State, Local not specified - PPO

## 2018-02-15 ENCOUNTER — Inpatient Hospital Stay: Payer: Federal, State, Local not specified - PPO | Attending: Oncology | Admitting: Oncology

## 2018-02-15 ENCOUNTER — Telehealth: Payer: Self-pay | Admitting: Oncology

## 2018-02-15 VITALS — BP 139/81 | HR 50 | Temp 98.6°F | Resp 18 | Ht 66.0 in | Wt 142.1 lb

## 2018-02-15 DIAGNOSIS — C50211 Malignant neoplasm of upper-inner quadrant of right female breast: Secondary | ICD-10-CM

## 2018-02-15 DIAGNOSIS — Z170 Estrogen receptor positive status [ER+]: Secondary | ICD-10-CM

## 2018-02-15 DIAGNOSIS — D0511 Intraductal carcinoma in situ of right breast: Secondary | ICD-10-CM | POA: Diagnosis not present

## 2018-02-15 LAB — CMP (CANCER CENTER ONLY)
ALBUMIN: 4 g/dL (ref 3.5–5.0)
ALK PHOS: 71 U/L (ref 40–150)
ALT: 20 U/L (ref 0–55)
ANION GAP: 7 (ref 3–11)
AST: 19 U/L (ref 5–34)
BUN: 14 mg/dL (ref 7–26)
CALCIUM: 9.6 mg/dL (ref 8.4–10.4)
CO2: 27 mmol/L (ref 22–29)
Chloride: 106 mmol/L (ref 98–109)
Creatinine: 0.8 mg/dL (ref 0.60–1.10)
Glucose, Bld: 84 mg/dL (ref 70–140)
POTASSIUM: 3.9 mmol/L (ref 3.5–5.1)
Sodium: 140 mmol/L (ref 136–145)
TOTAL PROTEIN: 7.5 g/dL (ref 6.4–8.3)
Total Bilirubin: 0.3 mg/dL (ref 0.2–1.2)

## 2018-02-15 LAB — CBC WITH DIFFERENTIAL (CANCER CENTER ONLY)
BASOS PCT: 1 %
Basophils Absolute: 0 10*3/uL (ref 0.0–0.1)
Eosinophils Absolute: 0.2 10*3/uL (ref 0.0–0.5)
Eosinophils Relative: 3 %
HCT: 38.9 % (ref 34.8–46.6)
Hemoglobin: 12.3 g/dL (ref 11.6–15.9)
LYMPHS ABS: 2.6 10*3/uL (ref 0.9–3.3)
LYMPHS PCT: 39 %
MCH: 25.7 pg (ref 25.1–34.0)
MCHC: 31.6 g/dL (ref 31.5–36.0)
MCV: 81.2 fL (ref 79.5–101.0)
MONO ABS: 0.4 10*3/uL (ref 0.1–0.9)
MONOS PCT: 6 %
NEUTROS ABS: 3.5 10*3/uL (ref 1.5–6.5)
Neutrophils Relative %: 51 %
Platelet Count: 271 10*3/uL (ref 145–400)
RBC: 4.79 MIL/uL (ref 3.70–5.45)
RDW: 14.6 % — AB (ref 11.2–14.5)
WBC Count: 6.7 10*3/uL (ref 3.9–10.3)

## 2018-02-15 NOTE — Telephone Encounter (Signed)
Per 6/24 no los. °

## 2018-03-01 DIAGNOSIS — Z01411 Encounter for gynecological examination (general) (routine) with abnormal findings: Secondary | ICD-10-CM | POA: Diagnosis not present

## 2018-04-19 DIAGNOSIS — H906 Mixed conductive and sensorineural hearing loss, bilateral: Secondary | ICD-10-CM | POA: Diagnosis not present

## 2018-04-19 DIAGNOSIS — H6982 Other specified disorders of Eustachian tube, left ear: Secondary | ICD-10-CM | POA: Diagnosis not present

## 2018-04-19 DIAGNOSIS — H748X2 Other specified disorders of left middle ear and mastoid: Secondary | ICD-10-CM | POA: Diagnosis not present

## 2018-04-19 DIAGNOSIS — H8001 Otosclerosis involving oval window, nonobliterative, right ear: Secondary | ICD-10-CM | POA: Diagnosis not present

## 2018-04-27 DIAGNOSIS — R634 Abnormal weight loss: Secondary | ICD-10-CM | POA: Diagnosis not present

## 2018-04-27 DIAGNOSIS — I10 Essential (primary) hypertension: Secondary | ICD-10-CM | POA: Diagnosis not present

## 2018-04-27 DIAGNOSIS — Z8639 Personal history of other endocrine, nutritional and metabolic disease: Secondary | ICD-10-CM | POA: Diagnosis not present

## 2018-05-07 DIAGNOSIS — R634 Abnormal weight loss: Secondary | ICD-10-CM | POA: Diagnosis not present

## 2018-05-18 DIAGNOSIS — R634 Abnormal weight loss: Secondary | ICD-10-CM | POA: Diagnosis not present

## 2018-05-24 ENCOUNTER — Ambulatory Visit: Payer: Federal, State, Local not specified - PPO

## 2018-05-24 ENCOUNTER — Ambulatory Visit
Admission: RE | Admit: 2018-05-24 | Discharge: 2018-05-24 | Disposition: A | Discharge status: Home / Self Care | Payer: Federal, State, Local not specified - PPO | Source: Ambulatory Visit | Attending: Obstetrics and Gynecology | Admitting: Obstetrics and Gynecology

## 2018-05-24 DIAGNOSIS — R928 Other abnormal and inconclusive findings on diagnostic imaging of breast: Secondary | ICD-10-CM | POA: Diagnosis not present

## 2018-05-24 DIAGNOSIS — N6489 Other specified disorders of breast: Secondary | ICD-10-CM

## 2018-07-13 DIAGNOSIS — Z1211 Encounter for screening for malignant neoplasm of colon: Secondary | ICD-10-CM | POA: Diagnosis not present

## 2018-07-13 DIAGNOSIS — K080 Exfoliation of teeth due to systemic causes: Secondary | ICD-10-CM | POA: Diagnosis not present

## 2018-08-03 DIAGNOSIS — H40013 Open angle with borderline findings, low risk, bilateral: Secondary | ICD-10-CM | POA: Diagnosis not present

## 2018-08-03 DIAGNOSIS — H25813 Combined forms of age-related cataract, bilateral: Secondary | ICD-10-CM | POA: Diagnosis not present

## 2018-08-03 DIAGNOSIS — H04123 Dry eye syndrome of bilateral lacrimal glands: Secondary | ICD-10-CM | POA: Diagnosis not present

## 2018-08-03 DIAGNOSIS — R7303 Prediabetes: Secondary | ICD-10-CM | POA: Diagnosis not present

## 2018-08-30 DIAGNOSIS — I10 Essential (primary) hypertension: Secondary | ICD-10-CM | POA: Diagnosis not present

## 2018-08-30 DIAGNOSIS — R7303 Prediabetes: Secondary | ICD-10-CM | POA: Diagnosis not present

## 2018-08-30 DIAGNOSIS — Z8639 Personal history of other endocrine, nutritional and metabolic disease: Secondary | ICD-10-CM | POA: Diagnosis not present

## 2018-08-30 DIAGNOSIS — N3946 Mixed incontinence: Secondary | ICD-10-CM | POA: Diagnosis not present

## 2018-08-30 DIAGNOSIS — E7800 Pure hypercholesterolemia, unspecified: Secondary | ICD-10-CM | POA: Diagnosis not present

## 2018-08-30 DIAGNOSIS — Z1159 Encounter for screening for other viral diseases: Secondary | ICD-10-CM | POA: Diagnosis not present

## 2018-08-30 DIAGNOSIS — Z0000 Encounter for general adult medical examination without abnormal findings: Secondary | ICD-10-CM | POA: Diagnosis not present

## 2019-05-03 ENCOUNTER — Other Ambulatory Visit: Payer: Self-pay | Admitting: Obstetrics and Gynecology

## 2019-05-03 DIAGNOSIS — Z1231 Encounter for screening mammogram for malignant neoplasm of breast: Secondary | ICD-10-CM

## 2019-05-11 ENCOUNTER — Other Ambulatory Visit: Payer: Self-pay

## 2019-05-11 ENCOUNTER — Emergency Department (HOSPITAL_COMMUNITY): Payer: Federal, State, Local not specified - PPO

## 2019-05-11 ENCOUNTER — Emergency Department (HOSPITAL_COMMUNITY)
Admission: EM | Admit: 2019-05-11 | Discharge: 2019-05-12 | Disposition: A | Discharge status: Home / Self Care | Payer: Federal, State, Local not specified - PPO | Attending: Emergency Medicine | Admitting: Emergency Medicine

## 2019-05-11 DIAGNOSIS — Z79899 Other long term (current) drug therapy: Secondary | ICD-10-CM | POA: Insufficient documentation

## 2019-05-11 DIAGNOSIS — Z853 Personal history of malignant neoplasm of breast: Secondary | ICD-10-CM | POA: Insufficient documentation

## 2019-05-11 DIAGNOSIS — I10 Essential (primary) hypertension: Secondary | ICD-10-CM | POA: Diagnosis not present

## 2019-05-11 DIAGNOSIS — R079 Chest pain, unspecified: Secondary | ICD-10-CM | POA: Diagnosis present

## 2019-05-11 LAB — CBC
HCT: 44.2 % (ref 36.0–46.0)
Hemoglobin: 13.6 g/dL (ref 12.0–15.0)
MCH: 25.3 pg — ABNORMAL LOW (ref 26.0–34.0)
MCHC: 30.8 g/dL (ref 30.0–36.0)
MCV: 82.3 fL (ref 80.0–100.0)
Platelets: 326 10*3/uL (ref 150–400)
RBC: 5.37 MIL/uL — ABNORMAL HIGH (ref 3.87–5.11)
RDW: 14.9 % (ref 11.5–15.5)
WBC: 9 10*3/uL (ref 4.0–10.5)
nRBC: 0 % (ref 0.0–0.2)

## 2019-05-11 LAB — BASIC METABOLIC PANEL
Anion gap: 11 (ref 5–15)
BUN: 9 mg/dL (ref 8–23)
CO2: 25 mmol/L (ref 22–32)
Calcium: 9.8 mg/dL (ref 8.9–10.3)
Chloride: 105 mmol/L (ref 98–111)
Creatinine, Ser: 0.79 mg/dL (ref 0.44–1.00)
GFR calc Af Amer: >60 mL/min (ref >60)
GFR calc non Af Amer: >60 mL/min (ref >60)
Glucose, Bld: 102 mg/dL — ABNORMAL HIGH (ref 70–99)
Potassium: 3.9 mmol/L (ref 3.5–5.1)
Sodium: 141 mmol/L (ref 135–145)

## 2019-05-11 LAB — TROPONIN I (HIGH SENSITIVITY)
Troponin I (High Sensitivity): 4 ng/L (ref <18)
Troponin I (High Sensitivity): 4 ng/L (ref <18)

## 2019-05-11 MED ORDER — SODIUM CHLORIDE 0.9% FLUSH: 3.0000 mL | Freq: Once | INTRAVENOUS | Status: DC

## 2019-05-11 NOTE — ED Triage Notes (Signed)
Pt presents with HTN starting today. Pt seen a MD at [facility] today for the HTN, dizziness, room spinning and nausea starting today. SBP as high as 200 today. Pt is compliant with HTN meds

## 2019-05-11 NOTE — ED Notes (Signed)
Pt waiting outside with family.

## 2019-05-12 NOTE — ED Provider Notes (Signed)
[facility] EMERGENCY DEPARTMENT Provider Note   CSN: [id number] Arrival date & time: 05/11/19  1940     History   Chief Complaint Chief Complaint  Patient presents with  . Chest Pain    HPI Pamela Summers is a 68 y.o. female.     68 yo F with a chief complaints of hypertension.  Patient checked her blood pressure this morning and noted to be elevated.  Checked it throughout the day and noted to be persistently elevated.  She ended up calling her family doctor who suggested she go to an urgent care center.  There her blood pressure was over [id number] systolic and she was told that she needed to come to the ED immediately.  She refused EMS transport.  She denies chest pain or shortness of breath.  She has had some dizziness that is been off and on but last for seconds at a time usually upon standing.  Last episode was about 4 days ago.  She feels like she maybe has a mild headache.  No visual symptoms.  No unilateral numbness or weakness no difficulty with speech or swallowing.  Later during the discussion patient was seen by her family doc about a week ago and was told to come back in a week or two to have her BP rechecked.   The history is provided by the patient. The history is limited by a language barrier.  Chest Pain Associated symptoms: dizziness   Associated symptoms: no fever, no headache, no nausea, no palpitations, no shortness of breath and no vomiting   Illness Severity:  Moderate Onset quality:  Gradual Duration:  2 hours Timing:  Constant Progression:  Worsening Chronicity:  New Associated symptoms: no chest pain, no congestion, no fever, no headaches, no myalgias, no nausea, no rhinorrhea, no shortness of breath, no vomiting and no wheezing     Past Medical History:  Diagnosis Date  . Arthritis   . Breast cancer ([place]) 1989, 2014  . Cancer ([place])   . Complication of anesthesia    pt states"difficult to wake up"  . Hearing loss   . Heart murmur   .  Hypertension   . Personal history of radiation therapy   . PONV (postoperative nausea and vomiting)    "scop patch placed and does not work"    Patient Active Problem List   Diagnosis Date Noted  . Genetic testing 09/29/2017  . Essential hypertension 12/26/2014  . Abnormal thyroid blood test 03/30/2014  . Malignant neoplasm of upper-inner quadrant of right breast in female, estrogen receptor positive ([employer]) 07/18/2013  . Abnormal CXR 05/19/2012    Past Surgical History:  Procedure Laterality Date  . ABDOMINAL HYSTERECTOMY     partical  . BREAST BIOPSY Right 2014   [place] Core   . BREAST LUMPECTOMY Right 1990  . BREAST SURGERY  1990   lumpectomy - right  . GANGLION CYST EXCISION  2002 - approximate  . MASTECTOMY Right 2014  . STAPEDES SURGERY Right 2011  . TOTAL MASTECTOMY Right 04/14/2013   Procedure: TOTAL MASTECTOMY;  Surgeon: Haywood Lasso, MD;  Location: [facility];  Service: General;  Laterality: Right;     OB History   No obstetric history on file.      Home Medications    Prior to Admission medications   Medication Sig Start Date End Date Taking? Authorizing Provider  atorvastatin (LIPITOR) 20 MG tablet Take 20 mg by mouth daily.    [provider]  cholecalciferol (VITAMIN D) 1000 UNITS tablet Take 1,000 Units by mouth daily.    [provider]  hydrochlorothiazide (MICROZIDE) 12.5 MG capsule Take 12.5 mg by mouth daily. Reported on 08/23/2015    [provider]  metoprolol succinate (TOPROL-XL) 25 MG 24 hr tablet Take 1 tablet (25 mg total) by mouth daily. 08/28/15   Copland, Gay Filler, MD  traMADol (ULTRAM) 50 MG tablet Take 1 tablet (50 mg total) by mouth every 8 (eight) hours as needed. 09/18/16   Horald Pollen, MD    Family History Family History  Problem Relation Age of Onset  . Cancer Mother        uterine or ovarian cancer  . Heart disease Father   . Cancer Sister        maternal half sister with uterine or cervical  cancer; died in her 70s  . Cancer Maternal Uncle        unknown cancer  . Cancer Cousin        3 maternal cousins with unknown cancers  . Breast cancer Neg Hx     Social History Social History   Tobacco Use  . Smoking status: Never Smoker  . Smokeless tobacco: Never Used  Substance Use Topics  . Alcohol use: No  . Drug use: No     Allergies   Patient has no known allergies.   Review of Systems Review of Systems  Constitutional: Negative for chills and fever.  HENT: Negative for congestion and rhinorrhea.   Eyes: Negative for redness and visual disturbance.  Respiratory: Negative for shortness of breath and wheezing.   Cardiovascular: Negative for chest pain and palpitations.  Gastrointestinal: Negative for nausea and vomiting.  Genitourinary: Negative for dysuria and urgency.  Musculoskeletal: Negative for arthralgias and myalgias.  Skin: Negative for pallor and wound.  Neurological: Positive for dizziness. Negative for headaches.     Physical Exam Updated Vital Signs BP (!) 184/81   Pulse (!) 56   Temp 98.5 F (36.9 C) (Oral)   Resp 16   SpO2 100%   Physical Exam Vitals signs and nursing note reviewed.  Constitutional:      General: She is not in acute distress.    Appearance: She is well-developed. She is not diaphoretic.  HENT:     Head: Normocephalic and atraumatic.  Eyes:     Pupils: Pupils are equal, round, and reactive to light.  Neck:     Musculoskeletal: Normal range of motion and neck supple.  Cardiovascular:     Rate and Rhythm: Normal rate and regular rhythm.     Heart sounds: No murmur. No friction rub. No gallop.   Pulmonary:     Effort: Pulmonary effort is normal.     Breath sounds: No wheezing or rales.  Abdominal:     General: There is no distension.     Palpations: Abdomen is soft.     Tenderness: There is no abdominal tenderness.  Musculoskeletal:        General: No tenderness.  Skin:    General: Skin is warm and dry.   Neurological:     Mental Status: She is alert and oriented to person, place, and time.     GCS: GCS eye subscore is 4. GCS verbal subscore is 5. GCS motor subscore is 6.     Cranial Nerves: Cranial nerves are intact.     Sensory: Sensation is intact.     Motor: Motor function is intact.     Coordination:  Coordination is intact.     Gait: Gait is intact.     Comments: Benign neuro exam  Psychiatric:        Behavior: Behavior normal.      ED Treatments / Results  Labs (all labs ordered are listed, but only abnormal results are displayed) Labs Reviewed  BASIC METABOLIC PANEL - Abnormal; Notable for the following components:      Result Value   Glucose, Bld 102 (*)    All other components within normal limits  CBC - Abnormal; Notable for the following components:   RBC 5.37 (*)    MCH 25.3 (*)    All other components within normal limits  TROPONIN I (HIGH SENSITIVITY)  TROPONIN I (HIGH SENSITIVITY)    EKG EKG Interpretation  Date/Time:  Wednesday May 11 2019 20:20:39 EDT Ventricular Rate:  59 PR Interval:  168 QRS Duration: 90 QT Interval:  450 QTC Calculation: 445 R Axis:   42 Text Interpretation:  Sinus bradycardia Possible Left atrial enlargement Nonspecific T wave abnormality Abnormal ECG No significant change since last tracing Confirmed by Deno Etienne [phone]) on 05/12/2019 3:50:07 AM   Radiology Dg Chest 2 View  Result Date: 05/11/2019 CLINICAL DATA:  Hypertension EXAM: CHEST - 2 VIEW COMPARISON:  04/07/2016 FINDINGS: Clips in the right axillary region. Mild cardiomegaly. No focal opacity or pleural effusion. No pneumothorax. Post mastectomy changes on the right. IMPRESSION: No active cardiopulmonary disease.  Mild cardiomegaly. Electronically Signed   By: Donavan Foil M.D.   On: 05/11/2019 21:14    Procedures Procedures (including critical care time)  Medications Ordered in ED Medications  sodium chloride flush (NS) 0.9 % injection 3 mL (3 mLs  Intravenous Not Given 05/12/19 0404)     Initial Impression / Assessment and Plan / ED Course  I have reviewed the triage vital signs and the nursing notes.  Pertinent labs & imaging results that were available during my care of the patient were reviewed by me and considered in my medical decision making (see chart for details).       68 yo F with a cc of hypertension.  No noted symptoms otherwise.  Patient with some transient dizziness off and on but none recently.  Labwork performed in triage, negative delta trop. No renal dysfunction.    Patient asymptomatic with no noted s/s of end organ damage.  No chest pain, diaphoresis, nausea or other acs symptoms.  No headache or neurologic complaints, no unequal pulses, normal pulse ox without rales or sob.  Feel this is unlikely to be a Hypertensive Emergency and recent studies suggest no benefit for inpatient admission.  There are also no studies to my knowledge suggesting that patients with hypertensive urgency have increased risk for end organ disease.The patient will follow up closely with their PCP.  Compliance with their medication stressed.    Lowell Guitar, Cicero Duck EH, et al. Characteristics and outcomes of patients presenting with hypertensive urgency in the office setting. JAMA Intern Med. 2016 Jul 1; 176(7): 981-8.    4:40 AM:  I have discussed the diagnosis/risks/treatment options with the patient and believe the pt to be eligible for discharge home to follow-up with PCP. We also discussed returning to the ED immediately if new or worsening sx occur. We discussed the sx which are most concerning (e.g., sudden worsening pain, fever, inability to tolerate by mouth ) that necessitate immediate return. Medications administered to the patient during their visit and any new prescriptions provided to the  patient are listed below.  Medications given during this visit Medications  sodium chloride flush (NS) 0.9 % injection 3 mL (3 mLs  Intravenous Not Given 05/12/19 0404)     The patient appears reasonably screen and/or stabilized for discharge and I doubt any other medical condition or other [facility] requiring further screening, evaluation, or treatment in the ED at this time prior to discharge.    Final Clinical Impressions(s) / ED Diagnoses   Final diagnoses:  Asymptomatic hypertension    ED Discharge Orders    None       Deno Etienne, DO 05/12/19 [phone]

## 2019-05-12 NOTE — Discharge Instructions (Signed)
Return to the ED for chest pain, shortness of breath, visual symptoms.  Please follow up with your doctor.    Check your blood pressure only when directed by your doctor.

## 2019-05-12 NOTE — ED Notes (Signed)
Updated pt on room status and explained wait.

## 2019-06-20 ENCOUNTER — Other Ambulatory Visit: Payer: Self-pay

## 2019-06-20 ENCOUNTER — Ambulatory Visit
Admission: RE | Admit: 2019-06-20 | Discharge: 2019-06-20 | Disposition: A | Discharge status: Home / Self Care | Payer: Federal, State, Local not specified - PPO | Source: Ambulatory Visit | Attending: Obstetrics and Gynecology | Admitting: Obstetrics and Gynecology

## 2019-06-20 DIAGNOSIS — Z1231 Encounter for screening mammogram for malignant neoplasm of breast: Secondary | ICD-10-CM

## 2019-10-15 ENCOUNTER — Ambulatory Visit: Payer: Federal, State, Local not specified - PPO | Attending: Internal Medicine

## 2019-10-15 DIAGNOSIS — Z23 Encounter for immunization: Secondary | ICD-10-CM | POA: Insufficient documentation

## 2019-10-15 NOTE — Progress Notes (Signed)
   Covid-19 Vaccination Clinic  Name:  Pamela Summers    MRN: [id number] DOB: 04-Oct-1950  10/15/2019  [name] was observed post Covid-19 immunization for 15 minutes without incidence. She was provided with Vaccine Information Sheet and instruction to access the V-Safe system.   [name] was instructed to call 911 with any severe reactions post vaccine: Marland Kitchen Difficulty breathing  . Swelling of your face and throat  . A fast heartbeat  . A bad rash all over your body  . Dizziness and weakness    Immunizations Administered    Name Date Dose VIS Date Route   Pfizer COVID-19 Vaccine 10/15/2019  8:36 AM 0.3 mL 08/05/2019 Intramuscular   Manufacturer: [place]   Lot: [id number]   [place]: [id number]

## 2019-11-08 ENCOUNTER — Ambulatory Visit: Payer: Federal, State, Local not specified - PPO | Attending: Internal Medicine

## 2019-11-08 DIAGNOSIS — Z23 Encounter for immunization: Secondary | ICD-10-CM

## 2019-11-08 NOTE — Progress Notes (Signed)
   Covid-19 Vaccination Clinic  Name:  Pamela Summers    MRN: [id number] DOB: [birth year]  11/08/2019  Pamela Summers was observed post Covid-19 immunization for 15 minutes without incident. She was provided with Vaccine Information Sheet and instruction to access the V-Safe system.   Pamela Summers was instructed to call 911 with any severe reactions post vaccine: Marland Kitchen Difficulty breathing  . Swelling of face and throat  . A fast heartbeat  . A bad rash all over body  . Dizziness and weakness   Immunizations Administered    Name Date Dose VIS Date Route   Pfizer COVID-19 Vaccine 11/08/2019  9:29 AM 0.3 mL 08/05/2019 Intramuscular   Manufacturer: [place]   Lot: [id number]   [place]: [id number]

## 2020-05-29 ENCOUNTER — Other Ambulatory Visit: Payer: Self-pay | Admitting: Obstetrics and Gynecology

## 2020-05-29 DIAGNOSIS — Z1231 Encounter for screening mammogram for malignant neoplasm of breast: Secondary | ICD-10-CM

## 2020-06-09 ENCOUNTER — Ambulatory Visit: Payer: Federal, State, Local not specified - PPO | Attending: Internal Medicine

## 2020-06-09 DIAGNOSIS — Z23 Encounter for immunization: Secondary | ICD-10-CM

## 2020-06-09 NOTE — Progress Notes (Signed)
   [facility]  Name:  Pamela Summers    MRN: [id number] DOB: [birth year]  06/09/2020  Pamela Summers was observed post Covid-19 immunization for 15 minutes without incident. She was provided with Vaccine Information Sheet and instruction to access the V-Safe system.   Pamela Summers was instructed to call [phone] with any severe reactions post vaccine: Marland Kitchen Difficulty breathing  . Swelling of face and throat  . A fast heartbeat  . A bad rash all over body  . Dizziness and weakness

## 2020-06-25 ENCOUNTER — Other Ambulatory Visit: Payer: Self-pay

## 2020-06-25 ENCOUNTER — Other Ambulatory Visit: Payer: Self-pay | Admitting: Obstetrics and Gynecology

## 2020-06-25 ENCOUNTER — Ambulatory Visit
Admission: RE | Admit: 2020-06-25 | Discharge: 2020-06-25 | Disposition: A | Discharge status: Home / Self Care | Payer: Federal, State, Local not specified - PPO | Source: Ambulatory Visit | Attending: Obstetrics and Gynecology | Admitting: Obstetrics and Gynecology

## 2020-06-25 DIAGNOSIS — Z1231 Encounter for screening mammogram for malignant neoplasm of breast: Secondary | ICD-10-CM

## 2020-08-01 ENCOUNTER — Other Ambulatory Visit: Payer: Self-pay

## 2020-08-01 ENCOUNTER — Ambulatory Visit
Admission: RE | Admit: 2020-08-01 | Discharge: 2020-08-01 | Disposition: A | Discharge status: Home / Self Care | Payer: Federal, State, Local not specified - PPO | Source: Ambulatory Visit | Attending: Obstetrics and Gynecology | Admitting: Obstetrics and Gynecology

## 2020-08-01 DIAGNOSIS — Z1231 Encounter for screening mammogram for malignant neoplasm of breast: Secondary | ICD-10-CM

## 2020-08-13 IMAGING — CR DG CHEST 2V
2 series · 2 of 2 positions shown · non-contrast
Comparison: 04/07/2016

CLINICAL DATA: Hypertension

EXAM:
CHEST - 2 VIEW

[chest pa]
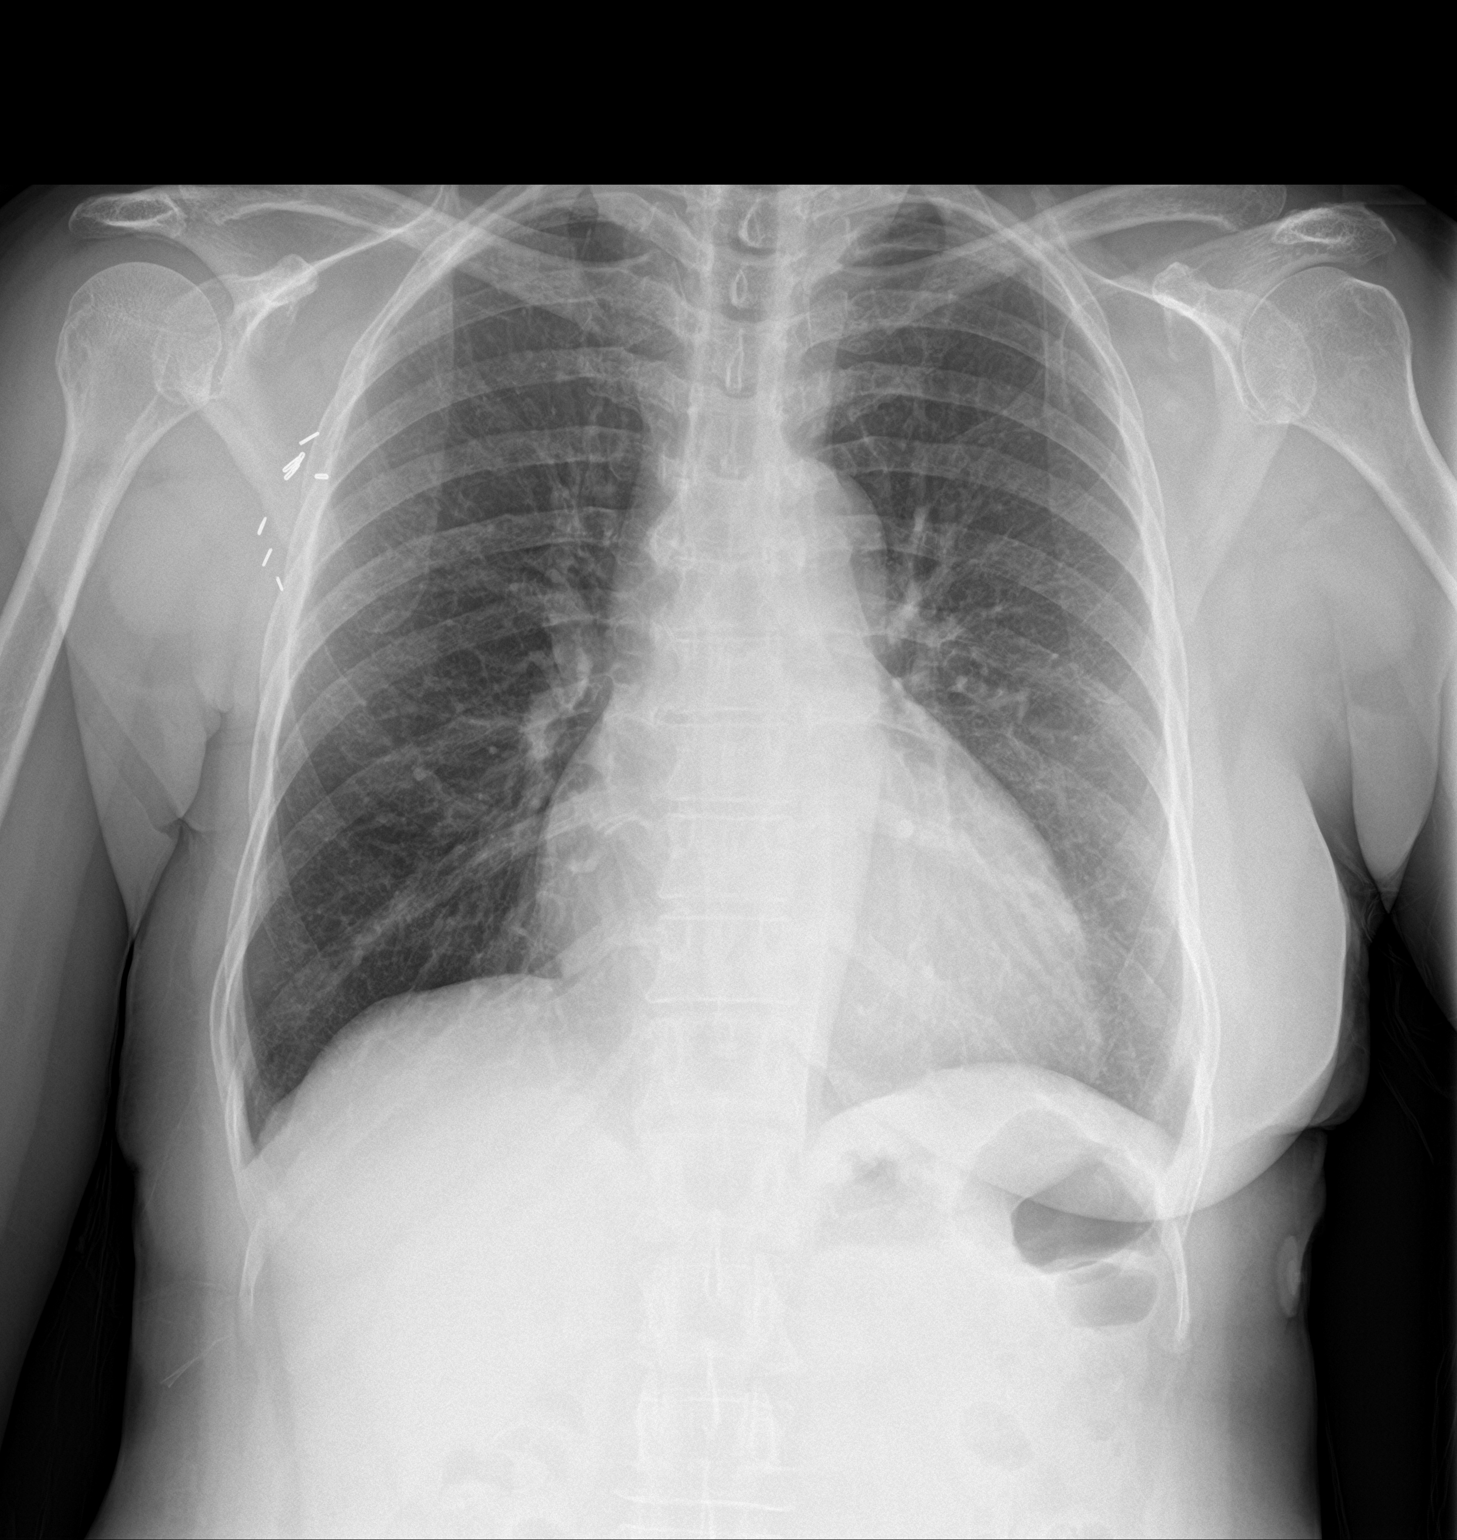

[chest lat]
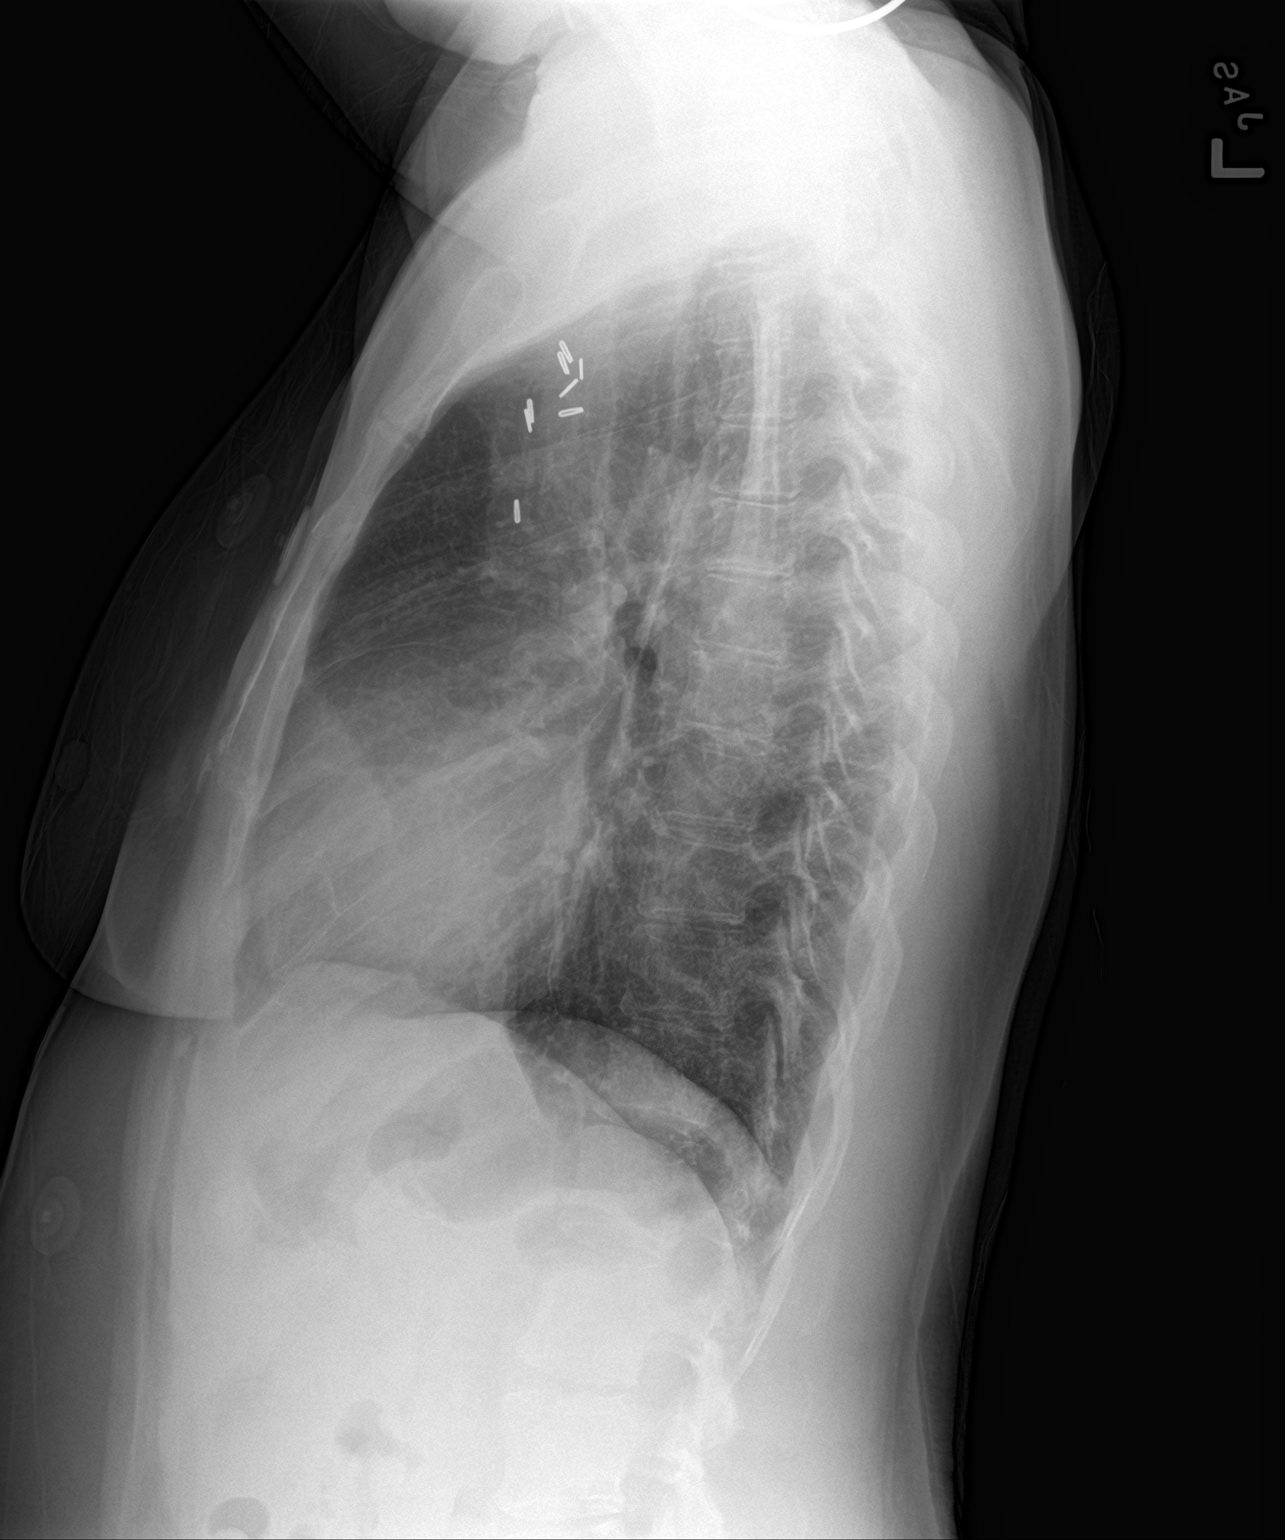

[2 of 2 positions shown; findings below may reference images not displayed]

FINDINGS: Clips in the right axillary region. Mild cardiomegaly. No focal
opacity or pleural effusion. No pneumothorax. Post mastectomy
changes on the right.
IMPRESSION: No active cardiopulmonary disease.  Mild cardiomegaly.

## 2021-06-25 ENCOUNTER — Other Ambulatory Visit: Payer: Self-pay | Admitting: Obstetrics and Gynecology

## 2021-06-25 DIAGNOSIS — Z1231 Encounter for screening mammogram for malignant neoplasm of breast: Secondary | ICD-10-CM

## 2021-08-05 ENCOUNTER — Ambulatory Visit
Admission: RE | Admit: 2021-08-05 | Discharge: 2021-08-05 | Disposition: A | Discharge status: Home / Self Care | Payer: Federal, State, Local not specified - PPO | Source: Ambulatory Visit | Attending: Obstetrics and Gynecology | Admitting: Obstetrics and Gynecology

## 2021-08-05 DIAGNOSIS — Z1231 Encounter for screening mammogram for malignant neoplasm of breast: Secondary | ICD-10-CM

## 2023-01-26 ENCOUNTER — Other Ambulatory Visit: Payer: Self-pay | Admitting: Obstetrics and Gynecology

## 2023-01-26 DIAGNOSIS — Z1231 Encounter for screening mammogram for malignant neoplasm of breast: Secondary | ICD-10-CM

## 2023-02-12 ENCOUNTER — Ambulatory Visit
Admission: RE | Admit: 2023-02-12 | Discharge: 2023-02-12 | Disposition: A | Discharge status: Home / Self Care | Payer: Federal, State, Local not specified - PPO | Source: Ambulatory Visit | Attending: Obstetrics and Gynecology | Admitting: Obstetrics and Gynecology

## 2023-02-12 DIAGNOSIS — Z1231 Encounter for screening mammogram for malignant neoplasm of breast: Secondary | ICD-10-CM

## 2023-02-16 ENCOUNTER — Other Ambulatory Visit: Payer: Self-pay | Admitting: Obstetrics and Gynecology

## 2023-02-16 DIAGNOSIS — R928 Other abnormal and inconclusive findings on diagnostic imaging of breast: Secondary | ICD-10-CM

## 2023-03-05 ENCOUNTER — Ambulatory Visit
Admission: RE | Admit: 2023-03-05 | Discharge: 2023-03-05 | Disposition: A | Discharge status: Home / Self Care | Payer: Federal, State, Local not specified - PPO | Source: Ambulatory Visit | Attending: Obstetrics and Gynecology | Admitting: Obstetrics and Gynecology

## 2023-03-05 DIAGNOSIS — R928 Other abnormal and inconclusive findings on diagnostic imaging of breast: Secondary | ICD-10-CM

## 2024-02-01 ENCOUNTER — Other Ambulatory Visit: Payer: Self-pay | Admitting: Family Medicine

## 2024-02-01 DIAGNOSIS — Z1231 Encounter for screening mammogram for malignant neoplasm of breast: Secondary | ICD-10-CM

## 2024-03-07 ENCOUNTER — Ambulatory Visit
Admission: RE | Admit: 2024-03-07 | Discharge: 2024-03-07 | Disposition: A | Discharge status: Home / Self Care | Source: Ambulatory Visit | Attending: Family Medicine | Admitting: Family Medicine

## 2024-03-07 DIAGNOSIS — Z1231 Encounter for screening mammogram for malignant neoplasm of breast: Secondary | ICD-10-CM

## 2024-05-23 ENCOUNTER — Ambulatory Visit
